# Patient Record
Sex: Male | Born: 1990
Health system: Southern US, Community
[De-identification: ages and names within clinical notes are randomized; demographics above are authoritative.]

## PROBLEM LIST (undated history)

## (undated) DIAGNOSIS — U071 COVID-19: Secondary | ICD-10-CM

## (undated) DIAGNOSIS — G919 Hydrocephalus, unspecified: Secondary | ICD-10-CM

## (undated) DIAGNOSIS — G40909 Epilepsy, unspecified, not intractable, without status epilepticus: Secondary | ICD-10-CM

## (undated) HISTORY — DX: COVID-19: U07.1

## (undated) HISTORY — PX: WISDOM TOOTH EXTRACTION: SHX21

## (undated) HISTORY — DX: Epilepsy, unspecified, not intractable, without status epilepticus: G40.909

## (undated) HISTORY — PX: OTHER SURGICAL HISTORY: SHX169

## (undated) HISTORY — DX: Hydrocephalus, unspecified: G91.9

---
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
## 2011-12-29 DIAGNOSIS — E259 Adrenogenital disorder, unspecified: Secondary | ICD-10-CM | POA: Insufficient documentation

## 2012-06-08 DIAGNOSIS — F8081 Childhood onset fluency disorder: Secondary | ICD-10-CM | POA: Insufficient documentation

## 2012-06-08 DIAGNOSIS — E559 Vitamin D deficiency, unspecified: Secondary | ICD-10-CM | POA: Insufficient documentation

## 2012-06-08 DIAGNOSIS — R569 Unspecified convulsions: Secondary | ICD-10-CM | POA: Insufficient documentation

## 2012-06-08 DIAGNOSIS — E230 Hypopituitarism: Secondary | ICD-10-CM | POA: Insufficient documentation

## 2012-06-08 DIAGNOSIS — Z8669 Personal history of other diseases of the nervous system and sense organs: Secondary | ICD-10-CM | POA: Insufficient documentation

## 2012-06-08 DIAGNOSIS — E2749 Other adrenocortical insufficiency: Secondary | ICD-10-CM | POA: Insufficient documentation

## 2012-06-08 DIAGNOSIS — E039 Hypothyroidism, unspecified: Secondary | ICD-10-CM | POA: Insufficient documentation

## 2012-06-08 DIAGNOSIS — Q892 Congenital malformations of other endocrine glands: Secondary | ICD-10-CM | POA: Insufficient documentation

## 2015-08-21 MED FILL — KEPPRA 750 MG TABLET: 750 | 30 days supply | Qty: 120 | Fill #0

## 2015-09-14 ENCOUNTER — Telehealth: Payer: Self-pay

## 2015-09-14 ENCOUNTER — Ambulatory Visit: Payer: Managed Care, Other (non HMO) | Admitting: Neurology

## 2015-09-14 ENCOUNTER — Ambulatory Visit (INDEPENDENT_AMBULATORY_CARE_PROVIDER_SITE_OTHER): Payer: BLUE CROSS/BLUE SHIELD | Admitting: Neurology

## 2015-09-14 ENCOUNTER — Encounter: Payer: Self-pay | Admitting: Neurology

## 2015-09-14 ENCOUNTER — Telehealth: Payer: Self-pay | Admitting: *Deleted

## 2015-09-14 VITALS — BP 116/78 | HR 85 | Ht 70.0 in | Wt 193.2 lb

## 2015-09-14 DIAGNOSIS — G919 Hydrocephalus, unspecified: Secondary | ICD-10-CM | POA: Diagnosis not present

## 2015-09-14 DIAGNOSIS — Z982 Presence of cerebrospinal fluid drainage device: Secondary | ICD-10-CM | POA: Diagnosis not present

## 2015-09-14 DIAGNOSIS — R569 Unspecified convulsions: Secondary | ICD-10-CM

## 2015-09-14 MED ORDER — KEPPRA 750 MG PO TABS: 1500.0000 mg | ORAL_TABLET | Freq: Two times a day (BID) | ORAL | Status: DC

## 2015-09-14 MED ORDER — VIMPAT 150 MG PO TABS: ORAL_TABLET | ORAL | Status: DC

## 2015-09-14 MED ORDER — VIMPAT 150 MG PO TABS: 150.0000 mg | ORAL_TABLET | Freq: Every day | ORAL | Status: DC

## 2015-09-14 NOTE — Progress Notes (Addendum)
[facility]    Provider:  Dr Lucia Gaskins Referring Provider: Arlina Robes, * Primary Care Physician:  Arlina Robes, MD  CC:  Seizure disorder  HPI:  Clayton Torres is a 25 y.o. male here as a referral from Dr. Merleen Milliner for seizures. He has a past medical history of hydrocephalus, panhypopituitarism and seizure disorder. He had shunt placement at [facility] at age 34 months in 6 and VP shunt revision at [facility] at age 58 years old in 2001. He was born with hydrocephalus. He had his shunt implanted at 7 months. He has had on revision since then in 2001. They follow regularly for his shunt and pituitary issues at [facility]. He has been on multiple seizure medications in the past including Actiom and Exteller xr with hyponatremia. He is currently on Vimpat and Keppra and having no side effects with good seizure control.  Everything else is going well. He works at a [employer] called [employer]. 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 Sleeping well. Hobbies and friends are all good. He has otherwise healthy and happy 25 year old male. He lives with his family. Mother and father are here today with him at his appointment and provide most information. Patient has had side effects and impaired seizure control to generic Keppra and he is thus maintained on brand only of Keppra. No other complaints or focal neurologic symptoms. Last seizure was in 05/2013 (see documentation below) after Vimpat was added to Keppra. He has no significant side effects from the antiepileptics and he maintains medication  Reviewed notes, labs and imaging from outside physicians, which showed:  In August 2006 Bulmaro fell at fitness gym in [place] we'll trying to lift weights. He had not eaten lunch that day and got very hot inside the gym he hit the left side of his head on the concrete floor that was covered with indoor outdoor carpet, and he had a generalized tonic-clonic  seizure. He was taken to the [facility] emergency room where he received 4 stitches and was admitted to pediatrics sways but then it for 24-hour observation as directed by chief of pediatric neurosurgery at [facility]. He was seen by Dr. Arleta Creek in consultation who did an EEG and placed [facility] on Dilantin. At that time they started seeing Dr. Desiree Lucy and [facility] it was the best neurologist close to home. He placed Trystan on Keppra 750 mg twice a day and stop the Dilantin. After going for over a year with no seizure activity, Dr. Blanche East is suggested trying to wean Jidenna off Keppra. Within 2 weeks in October 2007 patient had a generalized tonic-clonic seizure that lasted 20 minutes including post ictal state and he was taken to the [facility] emergency room where he was treated. In March 2014 all staying at a hotel in [facility] he had another generalized tonic-clonic seizure that lasted 3-5 minutes with shaking all over, drooling a post his ictal state lasting 15-20 minutes. He was brought to [facility] in [place] and had his Keppra level drawn. It appears at this time is when Keppra was changed to brand only. In June 2014 he had a grand mal seizure with postictal state lasting 15 minutes (the next or Keppra tablet and then 2 more in the morning and Keppra was increased to 750 mg 2 tablets at 8 AM and 2 tablets at 8 PM for a total of 1500 mg twice a  day. On October 2014 patient had another seizure in October and OXTELLAR XR and Aptiom which caused dangerously low blood sodium levels. After that time he was placed on Vimpat 100 mg twice a day and then increase to 150 mg twice a day which appears to have well controlled his seizures.  Labs drawn November 2016 included normal CBC, normal CMP with creatinine 0.60, vitamin D 25 hydroxy 39.4. Last Keppra levels ordered in June 2015 with Keppra normal level 20.3 and glucose of my 29.6  inches high however I have no idea if these were trough level.    Review of Systems: Patient complains of symptoms per HPI as well as the following symptoms: No CP, no SOB. Pertinent negatives per HPI. All others negative.   Social History   Social History  . Marital Status: Single    Spouse Name: N/A  . Number of Children: N/A  . Years of Education: N/A   Occupational History  . Not on file.   Social History Main Topics  . Smoking status: Never Smoker   . Smokeless tobacco: Not on file  . Alcohol Use: No  . Drug Use: No  . Sexual Activity: Not on file   Other Topics Concern  . Not on file   Social History Narrative   Lives w/ parents   Caffeine use: 1 cup coffee per day      PA's: [phone]       Family History  Problem Relation Age of Onset  . Hypertension    . Diabetes    . CAD    . COPD    . Seizures Neg Hx     Past Medical History  Diagnosis Date  . Hydrocephalus   . Seizure disorder [facility])     Past Surgical History  Procedure Laterality Date  . Other surgical history  1990    Shunt placement (VP) at [facility]  . Laparoscopic revision ventricular-peritoneal (v-p) shunt  2001    [facility]    Current Outpatient Prescriptions  Medication Sig Dispense Refill  . Aloe-Sodium Chloride (AYR SALINE NASAL GEL NA) Place 1 Dose into the nose daily.    Cathren Laine 4 MG/24HR PT24 patch Apply 4 mg topically daily.    . Ascorbic Acid (VITAMIN C) 1000 MG tablet Take 1,000 mg by mouth daily.    . Cholecalciferol (VITAMIN D-3 PO) Take 1,000 Units by mouth daily.    . ciclesonide (OMNARIS) 50 MCG/ACT nasal spray Place 2 sprays into both nostrils daily as needed for allergies.    . CORTEF 5 MG tablet Take 5 mg by mouth daily. TID- for sickness/stress BID- daily    . Fexofenadine HCl (ALLEGRA PO) Take 180 mg by mouth daily. Every other day    . fexofenadine-pseudoephedrine (ALLEGRA-D 24) 180-240 MG 24 hr tablet Take 1 tablet by mouth. Every other day    . KEPPRA 750 MG  tablet Take 2 tablets (1,500 mg total) by mouth 2 (two) times daily. 2 tabs 8am 2 tabs 8pm 120 tablet 2  . Multiple Vitamins tablet Take 1 tablet by mouth daily.    Marland Kitchen SYNTHROID 112 MCG tablet Take 112 mcg by mouth daily.    Marland Kitchen VIMPAT 150 MG TABS Take 1 tablet twice daily. 1 tablet at 8am and 1 tablet at 8pm. 60 tablet 11   No current facility-administered medications for this visit.    Allergies as of 09/14/2015 - Review Complete 09/14/2015  Allergen Reaction Noted  . Benzonatate Diarrhea 09/14/2015  . Ceftriaxone  Hives 09/14/2015  . Eslicarbazepine Other (See Comments) 09/14/2015  . Oxcarbazepine Other (See Comments) 09/14/2015    Vitals: BP 116/78 mmHg  Pulse 85  Ht 5\' 10"  (1.778 m)  Wt 193 lb 3.2 oz (87.635 kg)  BMI 27.72 kg/m2 Last Weight:  Wt Readings from Last 1 Encounters:  09/14/15 193 lb 3.2 oz (87.635 kg)   Last Height:   Ht Readings from Last 1 Encounters:  09/14/15 5\' 10"  (1.778 m)   Physical exam: Exam: Gen: NAD, conversant, well nourised,  well groomed                     CV: RRR, no MRG. No Carotid Bruits. No peripheral edema, warm, nontender Eyes: Conjunctivae clear without exudates or hemorrhage  Neuro: Detailed Neurologic Exam  Speech:    Speech is normal; fluent and spontaneous with normal comprehension.  Cognition:    The patient is oriented to person, place, and time;     recent and remote memory intact;     language fluent;     normal attention, concentration,     fund of knowledge Cranial Nerves:    The pupils are equal, round, and reactive to light. The fundi are flat. Visual fields are full to finger confrontation. Extraocular movements are intact. Trigeminal sensation is intact and the muscles of mastication are normal. The face is symmetric. The palate elevates in the midline. Hearing intact. Voice is normal. Shoulder shrug is normal. The tongue has normal motion without fasciculations.   Coordination:    Normal finger to nose and heel to  shin. Normal rapid alternating movements.   Gait:    Heel-toe and tandem gait are normal.   Motor Observation:    No asymmetry, no atrophy, and no involuntary movements noted. Tone:    Normal muscle tone.    Posture:    Posture is normal. normal erect    Strength:    Strength is V/V in the upper and lower limbs.      Sensation: intact to LT     Reflex Exam:  DTR's:    Deep tendon reflexes in the upper and lower extremities are normal bilaterally.   Toes:    The toes are downgoing bilaterally.   Clonus:    Clonus is absent.       Assessment/Plan:  This is a lovely 25 year old male here with his parents with a past medical history of hydrocephalus, panhypopituitarism and seizure disorder. He had shunt placement at [facility] at age 35 months in 50 and VP shunt revision at [facility] at age 33 years old in 2001. Patient is maintained on Keppra 1500 mg twice daily and Vimpat 150 mg twice daily with good seizure control. Last seizure was in October 2014. We'll continue these medications including brand name Keppra only. Follow-up in 6 months.    Patient was seeing NSY and endocrinology at [facility] No records there from Neurology. His neurologist Dr. Shary Decamp, will request notes, eegs and images. Thanks  Naomie Dean, MD  [facility] [address] [place], [place] [address]  Phone [phone] Fax [phone]

## 2015-09-14 NOTE — Telephone Encounter (Signed)
Called and spoke to [employer]. She is going to start PA's for vimpat and brand name Keppra for pt. Explained pt only has about 4 days left. Their insurance changed.

## 2015-09-14 NOTE — Patient Instructions (Signed)
Remember to drink plenty of fluid, eat healthy meals and do not skip any meals. Try to eat protein with a every meal and eat a healthy snack such as fruit or nuts in between meals. Try to keep a regular sleep-wake schedule and try to exercise daily, particularly in the form of walking, 20-30 minutes a day, if you can.   As far as your medications are concerned, I would like to suggest: Continue Current medications  I would like to see you back in 6 months, sooner if we need to. Please call us with any interim questions, concerns, problems, updates or refill requests.   Please also call us for any test results so we can go over those with you on the phone.  My clinical assistant and will answer any of your questions and relay your messages to me and also relay most of my messages to you.   Our phone number is [phone]. We also have an after hours call service for urgent matters and there is a physician on-call for urgent questions. For any emergencies you know to call 911 or go to the nearest emergency room

## 2015-09-14 NOTE — Telephone Encounter (Signed)
We contacted ins regarding both Keppra and Vimpat Rx's.  Spoke with [employer].  She said Vimpat is covered under the patient's plan, with no prior auth required.  All info for Keppra has been provided, and the request has been forwarded for review.   Ins has approved the request for coverage on Keppra effective until 09/13/2016 Ref # [id number]

## 2015-09-15 ENCOUNTER — Telehealth: Payer: Self-pay | Admitting: Neurology

## 2015-09-15 ENCOUNTER — Encounter: Payer: Self-pay | Admitting: Neurology

## 2015-09-15 DIAGNOSIS — G919 Hydrocephalus, unspecified: Secondary | ICD-10-CM | POA: Insufficient documentation

## 2015-09-15 DIAGNOSIS — R569 Unspecified convulsions: Secondary | ICD-10-CM | POA: Insufficient documentation

## 2015-09-15 DIAGNOSIS — Z982 Presence of cerebrospinal fluid drainage device: Secondary | ICD-10-CM | POA: Insufficient documentation

## 2015-09-15 NOTE — Telephone Encounter (Signed)
Pt's mother called and wanted to say "Thank You!!" for getting pts prior auth done so quickly for his medication. She received approval yesterday evening and said she cried she was so happy . She just wanted to let you all know that she really appreciated the hard work. No call back is necessary

## 2015-09-16 ENCOUNTER — Telehealth: Payer: Self-pay | Admitting: *Deleted

## 2015-09-16 NOTE — Telephone Encounter (Signed)
Release faxed to Dr.Fracis Clent Ridges requesting notes, imaging and lab report on 09/16/15.

## 2015-09-18 ENCOUNTER — Telehealth: Payer: Self-pay | Admitting: *Deleted

## 2015-09-18 NOTE — Telephone Encounter (Signed)
Patient progress notes, labs,are on [employer].

## 2015-09-22 NOTE — Telephone Encounter (Signed)
Received approval letter from [facility] for Keppra. Approval effective 09/14/2015-09/13/2016.  Letter dated 09/15/2015.

## 2015-09-23 ENCOUNTER — Encounter: Payer: Self-pay | Admitting: Neurology

## 2015-10-16 ENCOUNTER — Telehealth: Payer: Self-pay | Admitting: Neurology

## 2015-10-16 ENCOUNTER — Other Ambulatory Visit: Payer: Self-pay | Admitting: Neurology

## 2015-10-16 DIAGNOSIS — G40309 Generalized idiopathic epilepsy and epileptic syndromes, not intractable, without status epilepticus: Secondary | ICD-10-CM

## 2015-10-16 MED ORDER — LACOSAMIDE 200 MG PO TABS: 200.0000 mg | ORAL_TABLET | Freq: Two times a day (BID) | ORAL | Status: DC

## 2015-10-16 NOTE — Telephone Encounter (Addendum)
Please call patient' smother and let her know we apologize. The oncall said she called multiple times but no one answered.  I will call when I am done with patients as long as Ronaldo MiyamotoKyle is ok. I do think a referral to an epileptologist is warranted as much as we really enjoyed seeing them.  I will call them after I am done seeing patient this morning. thanks

## 2015-10-16 NOTE — Telephone Encounter (Signed)
Dr Ahern- what would you like to do? 

## 2015-10-16 NOTE — Telephone Encounter (Addendum)
Called and spoke to patient's mother. Apologized for what happened, on call tried to call multiple times. Relayed message per Dr Lucia GaskinsAhern. She verbalized understanding and knows to expect a call from Dr Lucia GaskinsAhern after she is done seeing pt's. Told her to call before 12pm if she needs anything.

## 2015-10-16 NOTE — Telephone Encounter (Signed)
I called once in March 9th without reaching Danae ChenBetty Langhans at [phone], left message twice on March 10th, was able to talk with Danae ChenBetty Lesniak briefly on 3rd attempt in March 10th, she has to hang up to pick another phone call.  His treating physician Dr. Lucia GaskinsAhern is aware that patient's family has called office.

## 2015-10-16 NOTE — Telephone Encounter (Addendum)
I spoke to patient, mother and father who are all very lovely. Apologized for the on call not being able to reach them last night.  Since Clayton Torres is still having seizures and on 2 medications, she would like to switch care to an epileptologist which I agree with.  I recommend Patrcia DollyKaren Aquino here at [facility]. She is an Herbalistepileptologist. I will forward a request to her. Thanks. Will increase his Vimapt to 200mg  twice daily. Called pharmacy.

## 2015-10-16 NOTE — Telephone Encounter (Signed)
Pt's mother called said pt had a seizure yesterday 10/15/15 about 7:45 pm. Pt was sitting at desk writing a check, she asked him a question but he didn't answer her. His face became red and he started jerking she gently pulled him over to the floor and kept him on his side. He was straining, jerking and drooling it lasted about 5-6 minutes. Postictal state was about 10-12 minutes. EMS was called, IV started and he transported to [facility]. He was able to walk to EMS vehicle at home and talked with EMS staff on the way to [facility]. He was given Keppra 1500mg  at hospital po and vimpat 150mg . Keppra 1grm IV was given at 10:14pm. Labs and CT were done and were all normal, shunt was checked and was normal. No EEG done. Dx was acute epilepitic seizure. He was discharged around midnight. F/u with neurologist tomorrow. Last seizure was 10/14. She said Dr Lucia GaskinsAhern was interested in referring pt to epileptologist. She said whatever Dr Lucia GaskinsAhern wants is what they will do. Pt's mother said she called [facility] after hours and got Cathy about 8pm (she was still at home) and was told she would get in touch with neurologist on call. She gave Lynden AngCathy her cell number. About 9:15 she called [facility] after hours again and spoke with Gala Romneyoug, said he saw the notes where she had called earlier. She gave him the main number and ED number at [facility] and cell number again. She said she never received a call from [facility]. She is very disappointed.

## 2015-10-22 ENCOUNTER — Encounter: Payer: Self-pay | Admitting: Neurology

## 2015-10-22 ENCOUNTER — Other Ambulatory Visit: Payer: Self-pay | Admitting: Neurology

## 2015-10-22 DIAGNOSIS — G40309 Generalized idiopathic epilepsy and epileptic syndromes, not intractable, without status epilepticus: Secondary | ICD-10-CM

## 2015-10-23 ENCOUNTER — Telehealth: Payer: Self-pay | Admitting: *Deleted

## 2015-10-23 ENCOUNTER — Telehealth: Payer: Self-pay | Admitting: Neurology

## 2015-10-23 DIAGNOSIS — Z5181 Encounter for therapeutic drug level monitoring: Secondary | ICD-10-CM

## 2015-10-23 NOTE — Telephone Encounter (Signed)
Spoke to patient, we need labs but he is over an hour away. We need to get those ordered [facility]. Annabelle HarmanDana, can you help?  I need a Keppra level, Lacosamide level and CMP and CBC with diff  Annabelle HarmanDana, can you help?

## 2015-10-23 NOTE — Telephone Encounter (Signed)
Mother called office. She would like lab orders to be faxed to [facility] (Fax: [phone], attn: Marisue IvanLiz). Advised Dr Lucia GaskinsAhern not in office. Will have to have WID sign lab orders for me to fax. They are going Monday morning to get labs done. They would like labs to be placed as STAT so they can receive them Monday afternoon or Tuesday morning. Mother would also like me to ask WID if its okay if her son takes an extra dose of Keppra everyday until they figure out the lab results. She is afraid he is going to have another seizure. She was told by the ER when they went that this would be ok. He currently takes 750mg  (2tablets, 2 times daily). Advised [facility] Dr Anne HahnWillis and call her back to advise before I leave today. She verbalized understanding.

## 2015-10-23 NOTE — Telephone Encounter (Signed)
I called the patient, talk with the mother. The patient had a seizure last week, the first seizure since 2014. The patient is on maximum doses of the Keppra and the Vimpat now. The last Keppra level was somewhat low, it'll be rechecked next week. I would not recommend taking extra Keppra as this will change the blood levels.

## 2015-10-23 NOTE — Telephone Encounter (Signed)
Faxed lab orders to [facility]. Attn: liz as requested. Fax: [phone]. Received confirmation.

## 2015-10-26 NOTE — Telephone Encounter (Signed)
Noted Kara Meadmma thanks.

## 2015-10-26 NOTE — Telephone Encounter (Signed)
Dr Lucia GaskinsAhern-- I already faxed lab orders to [facility] on Friday for lacosamide and keppra level? I had Dr Anne HahnWillis sign them. There were no orders in for CMP/CBC? Please advise. The mother said they were going this morning for those labs. [facility]

## 2015-10-27 ENCOUNTER — Telehealth: Payer: Self-pay | Admitting: *Deleted

## 2015-10-27 NOTE — Telephone Encounter (Signed)
Called and spoke to [place] [facility]. She stated labs for keppra, lacosamide not back yet. I gave her fax [phone] to have them fax results once back.

## 2015-10-28 ENCOUNTER — Encounter: Payer: Self-pay | Admitting: Neurology

## 2015-10-28 NOTE — Telephone Encounter (Signed)
Dr Lucia GaskinsAhern- please advise. I can call with results after you review, thank

## 2015-10-28 NOTE — Telephone Encounter (Signed)
They are normal thanks

## 2015-10-28 NOTE — Telephone Encounter (Signed)
Received lab results. Gave to Dr Lucia GaskinsAhern to review.

## 2015-10-28 NOTE — Telephone Encounter (Signed)
Advised both keppra and vimpat levels both within normal reference range. keppra 13.5 (reference range 10-40) and lacosamide 6/3 (reference range 5-10). She wrote values down. Advised we will follow in the future to continue to make sure he is within reference range for both. She verbalized understanding and knows to call if pt has new/worsening sx.

## 2015-10-28 NOTE — Telephone Encounter (Signed)
Pt's mother called wanting to know if lab results have come in. I told her our office has received them but Dr. Lucia GaskinsAhern has not reviewed them and as soon as she does she will call. Pt's mother is requesting a call to [phone]

## 2015-11-06 ENCOUNTER — Encounter: Payer: Self-pay | Admitting: Neurology

## 2015-11-09 ENCOUNTER — Ambulatory Visit (INDEPENDENT_AMBULATORY_CARE_PROVIDER_SITE_OTHER): Payer: BLUE CROSS/BLUE SHIELD | Admitting: Neurology

## 2015-11-09 ENCOUNTER — Encounter: Payer: Self-pay | Admitting: Neurology

## 2015-11-09 VITALS — BP 114/72 | HR 81 | Ht 70.0 in | Wt 194.0 lb

## 2015-11-09 DIAGNOSIS — G40209 Localization-related (focal) (partial) symptomatic epilepsy and epileptic syndromes with complex partial seizures, not intractable, without status epilepticus: Secondary | ICD-10-CM | POA: Diagnosis not present

## 2015-11-09 DIAGNOSIS — Q039 Congenital hydrocephalus, unspecified: Secondary | ICD-10-CM | POA: Diagnosis not present

## 2015-11-09 MED ORDER — KEPPRA 750 MG PO TABS: 1500.0000 mg | ORAL_TABLET | Freq: Two times a day (BID) | ORAL | Status: DC

## 2015-11-09 MED ORDER — LACOSAMIDE 200 MG PO TABS: 200.0000 mg | ORAL_TABLET | Freq: Two times a day (BID) | ORAL | Status: DC

## 2015-11-09 NOTE — Patient Instructions (Addendum)
1. Continue Keppra 750mg : take 2 tablets twice a day 2. Continue Vimpat 200mg : take 1 tablet twice a day 3. Schedule 1-hour EEG (not sleep-deprived) 4. Look into the Epilepsy Foundation website as a good resource (http://www.epilepsy.com) 5. Practice good sleep hygiene 6. Follow-up in 6 months, call for any problems  Seizure Precautions: 1. If medication has been prescribed for you to prevent seizures, take it exactly as directed.  Do not stop taking the medicine without talking to your doctor first, even if you have not had a seizure in a long time.   2. Avoid activities in which a seizure would cause danger to yourself or to others.  Don't operate dangerous machinery, swim alone, or climb in high or dangerous places, such as on ladders, roofs, or girders.  Do not drive unless your doctor says you may.  3. If you have any warning that you may have a seizure, lay down in a safe place where you can't hurt yourself.    4.  No driving for 6 months from last seizure, as per [place] law.   Please refer to the following link on the Epilepsy Foundation of America's website for more information: http://www.epilepsyfoundation.org/answerplace/Social/driving/drivingu.cfm   5.  Maintain good sleep hygiene. Avoid alcohol.  6.  Contact your doctor if you have any problems that may be related to the medicine you are taking.  7.  Call 911 and bring the patient back to the ED if:        A.  The seizure lasts longer than 5 minutes.       B.  The patient doesn't awaken shortly after the seizure  C.  The patient has new problems such as difficulty seeing, speaking or moving  D.  The patient was injured during the seizure  E.  The patient has a temperature over 102 F (39C)  F.  The patient vomited and now is having trouble breathing

## 2015-11-09 NOTE — Progress Notes (Signed)
NEUROLOGY CONSULTATION NOTE  Clayton Torres MRN: [id number] DOB: 04-02-1991  Referring provider: Dr. Naomie Dean Primary care provider: Dr. Ardean Larsen  Reason for consult:  Second opinion after recent breakthrough seizure  Dear Dr Lucia Gaskins:  Thank you for your kind referral of Clayton Torres for consultation of the above symptoms. Although his history is well known to you, please allow me to reiterate it for the purpose of our medical record. The patient was accompanied to the clinic by his parents who also provide collateral information. Records and images were personally reviewed where available.  HISTORY OF PRESENT ILLNESS: This is a pleasant 25 year old left-handed man with a history of congenital hydrocephalus s/p VP shunt at age 50 months and shunt revision in 2001, hypopituitarism, and seizures since 2006. The first seizure occurred in August 2006 in the setting of the patient not eating and being in a very hot environment. He hit the left side of his head and had a witnessed GTC. He has no recollection/warning that he could remember. He had an EEG at that time and was told they could "see where the spike was," and was started on Dilantin. He was switched by his neurologist in [place] to Keppra a few months after. He went over a year without any seizures and medication taper was discussed. Within 2 weeks, he had a seizure in October 2007 out of sleep. Keppra was resumed and he did well for almost 7 years without any seizures until January 2014 when he had another GTC and Keppra dose was increased. He had several seizures that year, in March 2014, June 2014, and October 2014. He recalls feeling dizzy with the seizure in 05/2013, he fell in the tub. He was taking Keppra  BID and Oxtellar was added. He had hyponatremia with a sodium of 119 and Oxtellar was stopped. He was also tried on Aptiom which also caused hyponatremia. He was started on Vimpat in October 2014 and dose of  BID  seemed to be the "magic combination" for him. His sodium levels stabilized between 135-138. Last Keppra level in March 2016 was 15. A Vimpat level in June 2015 was 29. He was again doing well with no seizures for almost 3 years until he had a witnessed convulsion on 10/15/15. He was at the computer writing a check, then his mother reported he did not answer her then turned red, followed by jerking and drooling for 5 minutes. No tongue bite or incontinence. After the seizure, he had sonorous respirations. He was also noted to break out in petechiae over his face. He was brought to [facility] ER where bloodwork where CBC and CMP were normal, Keppra level was low at 4.8. He had a head CT without contrast which showed the right-sided transparietal VP catheter terminating just left of the midline, stable to prior study, no hydrocephalus. He had been seeing neurologist Dr. Lucia Gaskins and Vimpat dose was increased to  BID. Most recent drug levels on 10/2015 showed a Keppra level of 13.7 (ref 10-40) and Vimpat level of 6.3 (ref 5-10).  They report that he got less sleep the night prior to the recent seizure, with only 4-1/2 hours of sleep. He denied any missed medication doses, no recent infections or alcohol intake. His parents deny any episodes of staring/unresponsiveness. He denies any gaps in time, olfactory/gustatory hallucinations, deja vu, rising epigastric sensation, focal numbness/tingling/weakness, myoclonic jerks. He works at a [employer], he does not drive.   Epilepsy Risk Factors:  Patient was born premature at 7 months with congenital hydrocephalus s/p VP shunt. He has a history of panhypopituitarism. Otherwise no history of febrile convulsions, CNS infections such as meningitis/encephalitis, significant traumatic brain injury, or family history of seizures.   Prior AEDs: Dilantin, generic Keppra (side effects and impaired seizure control per prior records)  PAST MEDICAL HISTORY: Past Medical  History  Diagnosis Date  . Hydrocephalus   . Seizure disorder (HCC)     PAST SURGICAL HISTORY: Past Surgical History  Procedure Laterality Date  . Other surgical history  1990    Shunt placement (VP) at [facility]  . Laparoscopic revision ventricular-peritoneal (v-p) shunt  2001    [facility]    MEDICATIONS: Current Outpatient Prescriptions on File Prior to Visit  Medication Sig Dispense Refill  . Aloe-Sodium Chloride (AYR SALINE NASAL GEL NA) Place 1 Dose into the nose daily.    Cathren Laine 4 MG/24HR PT24 patch Apply 4 mg topically daily.    . Ascorbic Acid (VITAMIN C) 1000 MG tablet Take 1,000 mg by mouth daily.    . Cholecalciferol (VITAMIN D-3 PO) Take 1,000 Units by mouth daily.    . ciclesonide (OMNARIS) 50 MCG/ACT nasal spray Place 2 sprays into both nostrils daily as needed for allergies.    . CORTEF 5 MG tablet Take 5 mg by mouth daily. TID- for sickness/stress BID- daily    . Fexofenadine HCl (ALLEGRA PO) Take 180 mg by mouth daily. Every other day    . fexofenadine-pseudoephedrine (ALLEGRA-D 24) 180-240 MG 24 hr tablet Take 1 tablet by mouth. Every other day    . KEPPRA 750 MG tablet Take 2 tablets (1,500 mg total) by mouth 2 (two) times daily. 2 tabs 8am 2 tabs 8pm 120 tablet 2  . lacosamide (VIMPAT) 200 MG TABS tablet Take 1 tablet (200 mg total) by mouth 2 (two) times daily. 60 tablet 5  . Multiple Vitamins tablet Take 1 tablet by mouth daily.    Marland Kitchen SYNTHROID 112 MCG tablet Take 112 mcg by mouth daily.     No current facility-administered medications on file prior to visit.    ALLERGIES: Allergies  Allergen Reactions  . Benzonatate Diarrhea  . Ceftriaxone Hives  . Eslicarbazepine Other (See Comments)    Low soduim  . Oxcarbazepine Other (See Comments)    Low soduim    FAMILY HISTORY: Family History  Problem Relation Age of Onset  . Hypertension    . Diabetes    . CAD    . COPD    . Seizures Neg Hx     SOCIAL HISTORY: Social History   Social History  .  Marital Status: Single    Spouse Name: N/A  . Number of Children: N/A  . Years of Education: N/A   Occupational History  . Not on file.   Social History Main Topics  . Smoking status: Never Smoker   . Smokeless tobacco: Not on file  . Alcohol Use: No  . Drug Use: No  . Sexual Activity: Not on file   Other Topics Concern  . Not on file   Social History Narrative   Lives w/ parents   Caffeine use: 1 cup coffee per day      PA's: [phone]       REVIEW OF SYSTEMS: Constitutional: No fevers, chills, or sweats, no generalized fatigue, change in appetite Eyes: No visual changes, double vision, eye pain Ear, nose and throat: No hearing loss, ear pain, nasal congestion, sore throat Cardiovascular: No  chest pain, palpitations Respiratory:  No shortness of breath at rest or with exertion, wheezes GastrointestinaI: No nausea, vomiting, diarrhea, abdominal pain, fecal incontinence Genitourinary:  No dysuria, urinary retention or frequency Musculoskeletal:  No neck pain, back pain Integumentary: No rash, pruritus, skin lesions Neurological: as above Psychiatric: No depression, insomnia, anxiety Endocrine: No palpitations, fatigue, diaphoresis, mood swings, change in appetite, change in weight, increased thirst Hematologic/Lymphatic:  No anemia, purpura, petechiae. Allergic/Immunologic: no itchy/runny eyes, nasal congestion, recent allergic reactions, rashes  PHYSICAL EXAM: Filed Vitals:   11/09/15 1356  BP: 114/72  Pulse: 81   General: No acute distress Head:  Normocephalic/atraumatic Eyes: Fundoscopic exam shows bilateral sharp discs, no vessel changes, exudates, or hemorrhages Neck: supple, no paraspinal tenderness, full range of motion Back: No paraspinal tenderness Heart: regular rate and rhythm Lungs: Clear to auscultation bilaterally. Vascular: No carotid bruits. Skin/Extremities: No rash, no edema Neurological Exam: Mental status: alert and oriented to person,  place, and time, no dysarthria or aphasia, Fund of knowledge is appropriate.  Recent and remote memory are intact. 3/3 delayed recall. Attention and concentration are normal.    Able to name objects and repeat phrases. Cranial nerves: CN I: not tested CN II: pupils equal, round and reactive to light, visual fields intact, fundi unremarkable. CN III, IV, VI:  full range of motion, no nystagmus, no ptosis CN V: facial sensation intact CN VII: upper and lower face symmetric CN VIII: hearing intact to finger rub CN IX, X: gag intact, uvula midline CN XI: sternocleidomastoid and trapezius muscles intact CN XII: tongue midline Bulk & Tone: normal, no fasciculations. Motor: 5/5 throughout with no pronator drift. Sensation: intact to light touch, cold, pin, vibration and joint position sense.  No extinction to double simultaneous stimulation.  Romberg test negative Deep Tendon Reflexes: +2 throughout, no ankle clonus Plantar responses: downgoing bilaterally Cerebellar: no incoordination on finger to nose, heel to shin. No dysdiadochokinesia Gait: narrow-based and steady, able to tandem walk adequately. Tremor: none  IMPRESSION: This is a pleasant 25 year old left-handed man with a history of congenital hydrocephalus s/p VP shunt, panhypopituitarism, and seizures since 2006. All his seizures have been convulsive seizures, possibly focal to bilateral tonic-clonic epilepsy. They have been told in the past of an abnormal EEG. Head CT after most recent seizure was unremarkable with stable shunt. A 1-hour EEG will be ordered to classify his seizures. He has a history of being seizure-free for up to 7 years, and had recently been seizure-free from 2014 until last 10/15/15 when he had a breakthrough convulsion. His Keppra level was low at 4.8, they report compliance to medication, we discussed how blood levels can go up and down, he was also sleep-deprived which may have further lowered seizure threshold. A  repeat Keppra trough level on the same Keppra dose of 1500mg  BID was 13.7. Vimpat dose was increased to 200mg  BID, which I agree with. Continue current doses of AEDs. We discussed avoidance of seizure triggers, including missed medication, sleep deprivation, and alcohol use. His mother had several questions regarding caffeine, TV/internet, weight, diet, which were answered to the best of my ability. He does not drive and is aware of [place] driving laws to stop driving after a seizure until 6 months seizure-free. He will follow-up in 6 months and knows to call our office for any problems.   Thank you for allowing me to participate in the care of this patient. Please do not hesitate to call for any questions or concerns.  Patrcia Dolly, M.D.  CC: Dr. Merleen Milliner, Dr. Lucia Gaskins

## 2015-11-11 ENCOUNTER — Encounter: Payer: Self-pay | Admitting: Neurology

## 2015-11-11 DIAGNOSIS — G40209 Localization-related (focal) (partial) symptomatic epilepsy and epileptic syndromes with complex partial seizures, not intractable, without status epilepticus: Secondary | ICD-10-CM | POA: Insufficient documentation

## 2015-11-11 DIAGNOSIS — Q039 Congenital hydrocephalus, unspecified: Secondary | ICD-10-CM | POA: Insufficient documentation

## 2015-11-19 ENCOUNTER — Ambulatory Visit (INDEPENDENT_AMBULATORY_CARE_PROVIDER_SITE_OTHER): Payer: BLUE CROSS/BLUE SHIELD | Admitting: Neurology

## 2015-11-19 DIAGNOSIS — G40209 Localization-related (focal) (partial) symptomatic epilepsy and epileptic syndromes with complex partial seizures, not intractable, without status epilepticus: Secondary | ICD-10-CM

## 2015-11-20 ENCOUNTER — Encounter: Payer: Self-pay | Admitting: Neurology

## 2015-11-23 ENCOUNTER — Other Ambulatory Visit: Payer: Self-pay | Admitting: Neurology

## 2015-11-23 DIAGNOSIS — G40209 Localization-related (focal) (partial) symptomatic epilepsy and epileptic syndromes with complex partial seizures, not intractable, without status epilepticus: Secondary | ICD-10-CM

## 2015-11-23 MED ORDER — KEPPRA 750 MG PO TABS: 1500.0000 mg | ORAL_TABLET | Freq: Two times a day (BID) | ORAL | Status: DC

## 2015-11-23 MED ORDER — LACOSAMIDE 200 MG PO TABS: 200.0000 mg | ORAL_TABLET | Freq: Two times a day (BID) | ORAL | Status: DC

## 2015-11-24 ENCOUNTER — Telehealth: Payer: Self-pay | Admitting: Neurology

## 2015-11-24 DIAGNOSIS — G40209 Localization-related (focal) (partial) symptomatic epilepsy and epileptic syndromes with complex partial seizures, not intractable, without status epilepticus: Secondary | ICD-10-CM

## 2015-11-24 NOTE — Procedures (Signed)
ELECTROENCEPHALOGRAM REPORT  Date of Study: 11/19/2015  Patient's Name: Clayton ReapKyle P Zuercher MRN: [id number] Date of Birth: 1990/12/17  Referring Provider: Dr. Patrcia DollyKaren Aquino  Clinical History: This is a 25 year old man with a history of congenital hydrocephalus s/p VP shunt, with convulsive seizures, seizure-free for 3 years until 10/15/15.   Medications: Keppra, Vimpat, Androderm, Vitamin C and D, Omnaris, Cortef, Allegra, multivitamins, Synthroid  Technical Summary: A multichannel digital 1-hour sleep-deprived EEG recording measured by the international 10-20 system with electrodes applied with paste and impedances below 5000 ohms performed in our laboratory with EKG monitoring in an awake and asleep patient.  Hyperventilation and photic stimulation were performed.  The digital EEG was referentially recorded, reformatted, and digitally filtered in a variety of bipolar and referential montages for optimal display.    Description: The patient is awake and asleep during the recording.  During maximal wakefulness, there is a symmetric, medium voltage 10 Hz posterior dominant rhythm that attenuates with eye opening.  There is occasional focal theta and delta slowing seen over the left posterior temporal region. During drowsiness and sleep, there is an increase in theta slowing of the background.  Vertex waves and symmetric sleep spindles were seen.  Hyperventilation and photic stimulation did not elicit any abnormalities.  There were occasional high voltage left posterior temporal spikes and sharp waves seen with phase reversal over T5. There is a single low to medium voltage sharp transient seen over the right frontal region. There were no electrographic seizures seen.    EKG lead was unremarkable.  Impression: This 1-hour awake and asleep EEG is abnormal due to the presence of: 1. Occasional focal slowing over the left posterior temporal region 2. Occasional epileptiform discharges over the left  posterior temporal region 3. Single sharp transient over the right frontal region  Clinical Correlation of the above findings indicates focal cerebral dysfunction over the left posterior temporal region suggestive of underlying structural or physiologic abnormality with possible epileptogenic potential. The single sharp transient seen over the right frontal region is of unclear clinical significance. Clinical correlation is advised.     Patrcia DollyKaren Aquino, M.D.

## 2015-11-24 NOTE — Telephone Encounter (Signed)
Tried calling Clayton Torres cell, no answer. Spoke to his mother about EEG results showing left posterior temporal epileptiform discharges and focal slowing. Would recommend MRI brain with and without contrast (open MRI). Continue current medications.   Tiffany, can you pls order open MRI brain with and without contrast seizure protocol? Thanks!

## 2015-11-30 NOTE — Telephone Encounter (Signed)
Patient has a VP shunt since childhood which was replaced in 2001. I have spoken with the [facility] MRI dept to check and see if patient is safe to have MRI scan. I faxed over the Implant information from patient's 2001 procedure for review also.

## 2015-11-30 NOTE — Telephone Encounter (Signed)
Called patient and spoke with his mother. Notified her of MRI appt on 12/04/15 # 3:00 pm with a 2:45 pm arrival @ [facility].

## 2015-11-30 NOTE — Telephone Encounter (Signed)
Received a voice mail msg from [place] in the MRI dept stating that after they reviewed patient device information that patient was safe to have MRI, and that I could proceed with scheduling.

## 2015-12-04 ENCOUNTER — Ambulatory Visit (HOSPITAL_COMMUNITY)
Admission: RE | Admit: 2015-12-04 | Discharge: 2015-12-04 | Disposition: A | Discharge status: Home / Self Care | Payer: BLUE CROSS/BLUE SHIELD | Source: Ambulatory Visit | Attending: Neurology | Admitting: Neurology

## 2015-12-04 DIAGNOSIS — Q048 Other specified congenital malformations of brain: Secondary | ICD-10-CM | POA: Insufficient documentation

## 2015-12-04 DIAGNOSIS — G40209 Localization-related (focal) (partial) symptomatic epilepsy and epileptic syndromes with complex partial seizures, not intractable, without status epilepticus: Secondary | ICD-10-CM | POA: Diagnosis present

## 2015-12-04 DIAGNOSIS — Z982 Presence of cerebrospinal fluid drainage device: Secondary | ICD-10-CM | POA: Diagnosis not present

## 2015-12-04 MED ORDER — GADOBENATE DIMEGLUMINE 529 MG/ML IV SOLN: 20.0000 mL | Freq: Once | INTRAVENOUS | Status: DC | PRN

## 2015-12-04 MED ORDER — GADOBENATE DIMEGLUMINE 529 MG/ML IV SOLN
20.0000 mL | Freq: Once | INTRAVENOUS | Status: AC | PRN
  Administered 2015-12-04: 20 mL via INTRAVENOUS

## 2015-12-09 ENCOUNTER — Telehealth: Payer: Self-pay | Admitting: Family Medicine

## 2015-12-09 NOTE — Telephone Encounter (Signed)
-----   Message from [place] Chardonika K Patel, DO sent at 12/07/2015  4:53 PM EDT ----- Elmarie Shileyiffany, can you inform the patient that his MRI does not show any acute changes, but there is evidence of developmental abnormalities which has most likely been longstanding and most likely explains his seizures.  Does he have any prior MRIs that we can compare this do?  Dr. Karel JarvisAquino will review the images and go over it at his next visit.  He should continue medication as he is taking. Thanks.

## 2015-12-09 NOTE — Telephone Encounter (Signed)
I spoke with patients mother/Bettye and notified her of results. She did review the report on my chart. She did have questions about number (1) on the impression, she would like an explanation on what all of that means. She did say they were aware of the results noted in the rest of the impression. She would like to speak with you if possible.

## 2015-12-09 NOTE — Telephone Encounter (Signed)
Returned call to patient's mother and answered all questions.  Last MRI brain was about 20 years ago which she will try to find the report.  She was previously aware of his developmental abnormalities seen on MRI, but was asking if there was surgery that could be performed to help with his seizures, which I did not see.  I recommended that he continue medical management.  Dr. Karel JarvisAquino will certainly readdress this at the next visit.   Donika K. Allena KatzPatel, DO

## 2016-02-23 ENCOUNTER — Telehealth: Payer: Self-pay | Admitting: Neurology

## 2016-02-23 NOTE — Telephone Encounter (Signed)
Records from [place]:  MRI [facility] gland [facility] on 12/16/1997:  Decreased pituitary size, absence of infundibular stalk and ectopic neurohypophysis consistent with congenital pituitary anomaly/ies which may be associated with panhypopituitarism and GHD.   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  History of ventricular shunt tube insertion for hydrocephalus.

## 2016-03-17 ENCOUNTER — Ambulatory Visit: Payer: BLUE CROSS/BLUE SHIELD | Admitting: Neurology

## 2016-05-16 ENCOUNTER — Ambulatory Visit (INDEPENDENT_AMBULATORY_CARE_PROVIDER_SITE_OTHER): Payer: BLUE CROSS/BLUE SHIELD | Admitting: Neurology

## 2016-05-16 ENCOUNTER — Encounter: Payer: Self-pay | Admitting: Neurology

## 2016-05-16 VITALS — BP 122/70 | HR 89 | Temp 97.9°F | Ht 70.0 in | Wt 196.5 lb

## 2016-05-16 DIAGNOSIS — Q039 Congenital hydrocephalus, unspecified: Secondary | ICD-10-CM

## 2016-05-16 DIAGNOSIS — G40209 Localization-related (focal) (partial) symptomatic epilepsy and epileptic syndromes with complex partial seizures, not intractable, without status epilepticus: Secondary | ICD-10-CM | POA: Diagnosis not present

## 2016-05-16 MED ORDER — LACOSAMIDE 200 MG PO TABS: 200.0000 mg | ORAL_TABLET | Freq: Two times a day (BID) | ORAL | 3 refills | Status: DC

## 2016-05-16 MED ORDER — KEPPRA 750 MG PO TABS: 1500.0000 mg | ORAL_TABLET | Freq: Two times a day (BID) | ORAL | 3 refills | Status: DC

## 2016-05-16 NOTE — Progress Notes (Signed)
NEUROLOGY FOLLOW UP OFFICE NOTE  Clayton Torres [id number]  HISTORY OF PRESENT ILLNESS: I had the pleasure of seeing Clayton Torres in follow-up in the neurology clinic on 05/16/2016.  The patient was last seen 6 months ago for recurrent seizures. He is again accompanied by his mother who helps supplement the history today.  Records and images were personally reviewed where available.  I personally reviewed MRI brain with and without contrast which did not show any acute changes. 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 There is a tiny developmental venous anomaly is noted in the anterior left frontal lobe. His 1-hour sleep-deprived EEG was abnormal with occasional focal slowing over the left posterior temporal region, occasional epileptiform discharges over the left posterior temporal region, a single sharp transient was seen over the right frontal region. He continues on Keppra 750mg  2 tabs BID and Vimpat 200mg  BID with no seizures since 10/15/15. He denies any side effects on higher dose of Vimpat. He and his mother deny any staring/unresponsive episodes, gaps in time, olfactory/gustatory hallucinations, deja vu, rising epigastric sensation, focal numbness/tingling/weakness, myoclonic jerks. His mother notices occasional memory issues.   HPI: This is a pleasant 25 yo LH man with a history of congenital hydrocephalus s/p VP shunt at age 74 months and shunt revision in 2001, hypopituitarism, and seizures since 2006. The first seizure occurred in August 2006 in the setting of the patient not eating and being in a very hot environment. He hit the left side of his head and had a witnessed GTC. He has no  recollection/warning that he could remember. He had an EEG at that time and was told they could "see where the spike was," and was started on Dilantin. He was switched by his neurologist in [place] to Keppra a few months after. He went over a year without any seizures and medication taper was discussed. Within 2 weeks, he had a seizure in October 2007 out of sleep. Keppra was resumed and he did well for almost 7 years without any seizures until January 2014 when he had another GTC and Keppra dose was increased. He had several seizures that year, in March 2014, June 2014, and October 2014. He recalls feeling dizzy with the seizure in 05/2013, he fell in the tub. He was taking Keppra 1500mg  BID and Oxtellar was added. He had hyponatremia with a sodium of 119 and Oxtellar was stopped. He was also tried on Aptiom which also caused hyponatremia. He was started on Vimpat in October 2014 and dose of 150mg  BID seemed to be the "magic combination" for him. His sodium levels stabilized between 135-138. Last Keppra level in March 2016 was 15. A Vimpat level in June 2015 was 29. He was again doing well with no seizures for almost 3 years until he had a witnessed convulsion on 10/15/15. He was at the computer writing a check, then his mother reported he did not answer her then turned red, followed by jerking and drooling for 5 minutes. No tongue bite or incontinence. After the seizure, he had sonorous respirations. He was also noted to break out in petechiae over his face. He was brought to [facility] ER where bloodwork where CBC and CMP were normal, Keppra  level was low at 4.8. He had a head CT without contrast which showed the right-sided transparietal VP catheter terminating just left of the midline, stable to prior study, no hydrocephalus. He had been seeing neurologist Dr. Lucia Gaskins and Vimpat dose was increased to 200mg  BID. Most recent drug levels on 10/2015 showed a Keppra level of 13.7 (ref 10-40) and Vimpat level of 6.3 (ref  5-10).  They report that he got less sleep the night prior to the recent seizure, with only 4-1/2 hours of sleep. He denied any missed medication doses, no recent infections or alcohol intake. His parents deny any episodes of staring/unresponsiveness. He denies any gaps in time, olfactory/gustatory hallucinations, deja vu, rising epigastric sensation, focal numbness/tingling/weakness, myoclonic jerks. He works at a [employer], he does not drive.   Epilepsy Risk Factors:  Patient was born premature at 7 months with congenital hydrocephalus s/p VP shunt. He has a history of panhypopituitarism. Otherwise no history of febrile convulsions, CNS infections such as meningitis/encephalitis, significant traumatic brain injury, or family history of seizures.  Prior AEDs: Dilantin, generic Keppra (side effects and impaired seizure control per prior records)  PAST MEDICAL HISTORY: Past Medical History:  Diagnosis Date  . Hydrocephalus   . Seizure disorder [facility])     MEDICATIONS: Current Outpatient Prescriptions on File Prior to Visit  Medication Sig Dispense Refill  . Aloe-Sodium Chloride (AYR SALINE NASAL GEL NA) Place 1 Dose into the nose daily.    Cathren Laine 4 MG/24HR PT24 patch Apply 4 mg topically daily.    . Ascorbic Acid (VITAMIN C) 1000 MG tablet Take 1,000 mg by mouth daily.    . Cholecalciferol (VITAMIN D-3 PO) Take 1,000 Units by mouth daily.    . ciclesonide (OMNARIS) 50 MCG/ACT nasal spray Place 2 sprays into both nostrils daily as needed for allergies.    . CORTEF 5 MG tablet Take 5 mg by mouth 2 (two) times daily. Will take 3 if increase stress    . Fexofenadine HCl (ALLEGRA PO) Take 180 mg by mouth daily. Every other day    . fexofenadine-pseudoephedrine (ALLEGRA-D 24) 180-240 MG 24 hr tablet Take 1 tablet by mouth. Every other day    . KEPPRA 750 MG tablet Take 2 tablets (1,500 mg total) by mouth 2 (two) times daily. 2 tabs 8am 2 tabs 8pm 120 tablet 11  . lacosamide  (VIMPAT) 200 MG TABS tablet Take 1 tablet (200 mg total) by mouth 2 (two) times daily. 60 tablet 5  . Multiple Vitamins tablet Take 1 tablet by mouth daily.    Marland Kitchen SYNTHROID 112 MCG tablet Take 112 mcg by mouth daily.     No current facility-administered medications on file prior to visit.     ALLERGIES: Allergies  Allergen Reactions  . Benzonatate Diarrhea  . Ceftriaxone Hives  . Eslicarbazepine Other (See Comments)    Low soduim  . Oxcarbazepine Other (See Comments)    Low soduim    FAMILY HISTORY: Family History  Problem Relation Age of Onset  . Hypertension    . Diabetes    . CAD    . COPD    . Cancer Paternal Grandmother   . Cancer Paternal Grandfather     SOCIAL HISTORY: Social History   Social History  . Marital status: Single    Spouse name: N/A  . Number of children: N/A  . 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  Smoking status: Never Smoker  . Smokeless tobacco: Never Used  . Alcohol use No  . Drug use: No  . Sexual activity: Not on file   Other Topics Concern  . Not on file   Social History Narrative   Lives w/ parents   Caffeine use: 1 cup coffee per day      PA's: [phone]       REVIEW OF SYSTEMS: Constitutional: No fevers, chills, or sweats, no generalized fatigue, change in appetite Eyes: No visual changes, double vision, eye pain Ear, nose and throat: No hearing loss, ear pain, nasal congestion, sore throat Cardiovascular: No chest pain, palpitations Respiratory:  No shortness of breath at rest or with exertion, wheezes GastrointestinaI: No nausea, vomiting, diarrhea, abdominal pain, fecal incontinence Genitourinary:  No dysuria, urinary retention or frequency Musculoskeletal:  No neck pain, back pain Integumentary: No rash, pruritus, skin lesions Neurological: as above Psychiatric: No depression, insomnia, anxiety Endocrine: No palpitations, fatigue,  diaphoresis, mood swings, change in appetite, change in weight, increased thirst Hematologic/Lymphatic:  No anemia, purpura, petechiae. Allergic/Immunologic: no itchy/runny eyes, nasal congestion, recent allergic reactions, rashes  PHYSICAL EXAM: Vitals:   05/16/16 1406  BP: 122/70  Pulse: 89  Temp: 97.9 F (36.6 C)   General: No acute distress Head:  Normocephalic/atraumatic Neck: supple, no paraspinal tenderness, full range of motion Heart:  Regular rate and rhythm Lungs:  Clear to auscultation bilaterally Back: No paraspinal tenderness Skin/Extremities: No rash, no edema Neurological Exam: alert and oriented to person, place, and time. No aphasia or dysarthria. Fund of knowledge is appropriate.  Recent and remote memory are intact. 2/3 delayed recall. Attention and concentration are normal.    Able to name objects and repeat phrases. Cranial nerves: Pupils equal, round, reactive to light. Extraocular movements intact with no nystagmus. Visual fields full. Facial sensation intact. No facial asymmetry. Tongue, uvula, palate midline.  Motor: Bulk and tone normal, muscle strength 5/5 throughout with no pronator drift.  Sensation to light touch intact.  No extinction to double simultaneous stimulation.  Deep tendon reflexes 2+ throughout, toes downgoing.  Finger to nose testing intact.  Gait narrow-based and steady, able to tandem walk adequately.  Romberg negative.  IMPRESSION: This is a pleasant 25 yo LH man with a history of congenital hydrocephalus s/p VP shunt, panhypopituitarism, and focal to bilateral tonic-clonic epilepsy since 2006. All his seizures have been convulsive seizures. 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 No further seizures since March 2017, he is on Keppra 1500mg  BID and Vimpat 200mg  BID. We discussed other treatment options for  epilepsy if seizures become difficult to control on medication (RNS, laser ablation). Continue on current medications for now. He does not drive and is aware of [place] driving laws to stop driving after a seizure until 6 months seizure-free. He will follow-up in 3 months and knows to call our office for any problems  Thank you for allowing me to participate in his care.  Please do not hesitate to call for any questions or concerns.  The duration of this appointment visit was 25 minutes of face-to-face time with the patient.  Greater than 50% of this time was spent in counseling, explanation of diagnosis, planning of further management, and coordination of care.   Patrcia DollyKaren Aquino, M.D.   CC: Dr. Merleen MillinerWinfield

## 2016-05-16 NOTE — Patient Instructions (Signed)
1. Continue all your current medications 2. Follow-up in 3 months, call for any changes  Seizure Precautions: 1. If medication has been prescribed for you to prevent seizures, take it exactly as directed.  Do not stop taking the medicine without talking to your doctor first, even if you have not had a seizure in a long time.   2. Avoid activities in which a seizure would cause danger to yourself or to others.  Don't operate dangerous machinery, swim alone, or climb in high or dangerous places, such as on ladders, roofs, or girders.  Do not drive unless your doctor says you may.  3. If you have any warning that you may have a seizure, lay down in a safe place where you can't hurt yourself.    4.  No driving for 6 months from last seizure, [place].   Please refer to the following link on the [employer]ile[employer]'s website for more information: http://www.epilepsyfoundation.org/answerplace/Social/driving/drivingu.cfm   5.  Maintain good sleep hygiene. Avoid alcohol  6.  Contact your doctor if you have any problems that may be related to the medicine you are taking.  7.  Call 911 and bring the patient back to the ED if:        A.  The seizure lasts longer than 5 minutes.       B.  The patient doesn't awaken shortly after the seizure  C.  The patient has new problems such as difficulty seeing, speaking or moving  D.  The patient was injured during the seizure  E.  The patient has a temperature over 102 F (39C)  F.  The patient vomited and now is having trouble breathing

## 2016-05-18 ENCOUNTER — Encounter: Payer: Self-pay | Admitting: Neurology

## 2016-05-24 ENCOUNTER — Encounter: Payer: Self-pay | Admitting: Neurology

## 2016-07-14 ENCOUNTER — Encounter: Payer: Self-pay | Admitting: Neurology

## 2016-07-27 ENCOUNTER — Encounter: Payer: Self-pay | Admitting: Neurology

## 2016-08-10 ENCOUNTER — Telehealth: Payer: Self-pay

## 2016-08-10 NOTE — Telephone Encounter (Signed)
Contacted patients mother to see if insurance forms were approved so I could send RX refill for Keppra. Mother states they are being reviewed and she will contact office when she needs refill sent. She states patient has enough Keppra to do for now.

## 2016-08-15 ENCOUNTER — Encounter: Payer: Self-pay | Admitting: Neurology

## 2016-08-23 ENCOUNTER — Ambulatory Visit (INDEPENDENT_AMBULATORY_CARE_PROVIDER_SITE_OTHER): Payer: Self-pay | Admitting: Neurology

## 2016-08-23 ENCOUNTER — Encounter: Payer: Self-pay | Admitting: Neurology

## 2016-08-23 VITALS — BP 118/66 | HR 111 | Ht 70.0 in | Wt 199.4 lb

## 2016-08-23 DIAGNOSIS — Q039 Congenital hydrocephalus, unspecified: Secondary | ICD-10-CM

## 2016-08-23 DIAGNOSIS — G40209 Localization-related (focal) (partial) symptomatic epilepsy and epileptic syndromes with complex partial seizures, not intractable, without status epilepticus: Secondary | ICD-10-CM

## 2016-08-23 NOTE — Patient Instructions (Signed)
1. Continue all your medications 2. Call our office for any problems, follow-up in 4 months  Seizure Precautions: 1. If medication has been prescribed for you to prevent seizures, take it exactly as directed.  Do not stop taking the medicine without talking to your doctor first, even if you have not had a seizure in a long time.   2. Avoid activities in which a seizure would cause danger to yourself or to others.  Don't operate dangerous machinery, swim alone, or climb in high or dangerous places, such as on ladders, roofs, or girders.  Do not drive unless your doctor says you may.  3. If you have any warning that you may have a seizure, lay down in a safe place where you can't hurt yourself.    4.  No driving for 6 months from last seizure, as per [place] [facility] [place] law.   Please refer to the following link on the [employer]ile[employer]'s website for more information: http://www.epilepsyfoundation.org/answerplace/Social/driving/drivingu.cfm   5.  Maintain good sleep hygiene. Avoid alcohol.  6.  Contact your doctor if you have any problems that may be related to the medicine you are taking.  7.  Call 911 and bring the patient back to the ED if:        A.  The seizure lasts longer than 5 minutes.       B.  The patient doesn't awaken shortly after the seizure  C.  The patient has new problems such as difficulty seeing, speaking or moving  D.  The patient was injured during the seizure  E.  The patient has a temperature over 102 F (39C)  F.  The patient vomited and now is having trouble breathing

## 2016-08-23 NOTE — Progress Notes (Signed)
NEUROLOGY FOLLOW UP OFFICE NOTE  Clayton Torres [id number]  HISTORY OF PRESENT ILLNESS: I had the pleasure of seeing Clayton Torres in follow-up in the neurology clinic on 08/23/2016. The patient was last seen 3 months ago for recurrent seizures. He is again accompanied by his parents who helps supplement the history today. Since his last visit, he continues to do well with no seizures since 10/15/15 on Keppra 750mg  2 tabs BID and Vimpat 200mg  BID. No side effects. He denies any staring/unresponsive episodes, gaps in time, olfactory/gustatory hallucinations, deja vu, rising epigastric sensation, focal numbness/tingling/weakness, myoclonic jerks. He denies any headaches, dizziness, diplopia, focal numbness/tingling/weakness, no falls.   HPI: This is a pleasant 26 yo LH man with a history of congenital hydrocephalus s/p VP shunt at age 58 months and shunt revision in 2001, hypopituitarism, and seizures since 2006. The first seizure occurred in August 2006 in the setting of the patient not eating and being in a very hot environment. He hit the left side of his head and had a witnessed GTC. He has no recollection/warning that he could remember. He had an EEG at that time and was told they could "see where the spike was," and was started on Dilantin. He was switched by his neurologist in [place] to Keppra a few months after. He went over a year without any seizures and medication taper was discussed. Within 2 weeks, he had a seizure in October 2007 out of sleep. Keppra was resumed and he did well for almost 7 years without any seizures until January 2014 when he had another GTC and Keppra dose was increased. He had several seizures that year, in March 2014, June 2014, and October 2014. He recalls feeling dizzy with the seizure in 05/2013, he fell in the tub. He was taking Keppra 1500mg  BID and Oxtellar was added. He had hyponatremia with a sodium of 119 and Oxtellar was stopped. He was also tried on Aptiom which also  caused hyponatremia. He was started on Vimpat in October 2014 and dose of 150mg  BID seemed to be the "magic combination" for him. His sodium levels stabilized between 135-138. Last Keppra level in March 2016 was 15. A Vimpat level in June 2015 was 29. He was again doing well with no seizures for almost 3 years until he had a witnessed convulsion on 10/15/15. He was at the computer writing a check, then his mother reported he did not answer her then turned red, followed by jerking and drooling for 5 minutes. No tongue bite or incontinence. After the seizure, he had sonorous respirations. He was also noted to break out in petechiae over his face. He was brought to [facility] ER where bloodwork where CBC and CMP were normal, Keppra level was low at 4.8. He had a head CT without contrast which showed the right-sided transparietal VP catheter terminating just left of the midline, stable to prior study, no hydrocephalus. He had been seeing neurologist Dr. Lucia Gaskins and Vimpat dose was increased to 200mg  BID. Most recent drug levels on 10/2015 showed a Keppra level of 13.7 (ref 10-40) and Vimpat level of 6.3 (ref 5-10).  They report that he got less sleep the night prior to the recent seizure, with only 4-1/2 hours of sleep. He denied any missed medication doses, no recent infections or alcohol intake. His parents deny any episodes of staring/unresponsiveness. He denies any gaps in time, olfactory/gustatory hallucinations, deja vu, rising epigastric sensation, focal numbness/tingling/weakness, myoclonic jerks. He works at a [employer],  he does not drive.   Epilepsy Risk Factors:  Patient was born premature at 7 months with congenital hydrocephalus s/p VP shunt. He has a history of panhypopituitarism. Otherwise no history of febrile convulsions, CNS infections such as meningitis/encephalitis, significant traumatic brain injury, or family history of seizures.  Diagnostic Data: I personally reviewed MRI brain with and  without contrast which did not show any acute changes. 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 There is a tiny developmental venous anomaly is noted in the anterior left frontal lobe. His 1-hour sleep-deprived EEG was abnormal with occasional focal slowing over the left posterior temporal region, occasional epileptiform discharges over the left posterior temporal region, a single sharp transient was seen over the right frontal region.   Prior AEDs: Dilantin, generic Keppra (side effects and impaired seizure control per prior records)  PAST MEDICAL HISTORY: Past Medical History:  Diagnosis Date  . Hydrocephalus   . Seizure disorder [facility](HCC)     MEDICATIONS: Current Outpatient Prescriptions on File Prior to Visit  Medication Sig Dispense Refill  . Aloe-Sodium Chloride (AYR SALINE NASAL GEL NA) Place 1 Dose into the nose daily.    Cathren Laine. ANDRODERM 4 MG/24HR PT24 patch Apply 4 mg topically daily.    . Ascorbic Acid (VITAMIN C) 1000 MG tablet Take 1,000 mg by mouth daily.    . Cholecalciferol (VITAMIN D-3 PO) Take 1,000 Units by mouth daily.    . ciclesonide (OMNARIS) 50 MCG/ACT nasal spray Place 2 sprays into both nostrils daily as needed for allergies.    . CORTEF 5 MG tablet Take 5 mg by mouth 2 (two) times daily. Will take 3 if increase stress    . Fexofenadine HCl (ALLEGRA PO) Take 180 mg by mouth daily. Every other day    . fexofenadine-pseudoephedrine (ALLEGRA-D 24) 180-240 MG 24 hr tablet Take 1 tablet by mouth. Every other day    . KEPPRA 750 MG tablet Take 2 tablets (1,500 mg total) by mouth 2 (two) times daily. 2 tabs 8am 2 tabs 8pm 360 tablet 3  . lacosamide (VIMPAT) 200 MG TABS  tablet Take 1 tablet (200 mg total) by mouth 2 (two) times daily. 180 tablet 3  . Multiple Vitamins tablet Take 1 tablet by mouth daily.    Marland Kitchen. SYNTHROID 112 MCG tablet Take 112 mcg by mouth daily.     No current facility-administered medications on file prior to visit.     ALLERGIES: Allergies  Allergen Reactions  . Benzonatate Diarrhea  . Ceftriaxone Hives  . Eslicarbazepine Other (See Comments)    Low soduim  . Oxcarbazepine Other (See Comments)    Low soduim    FAMILY HISTORY: Family History  Problem Relation Age of Onset  . Hypertension    . Diabetes    . CAD    . COPD    . Cancer Paternal Grandmother   . Cancer Paternal Grandfather     SOCIAL HISTORY: Social History   Social History  . Marital status: Single    Spouse name: N/A  . Number of children: N/A  . Years of education: N/A   Occupational History  . 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 Smoking status: Never Smoker  .  Smokeless tobacco: Never Used  . Alcohol use No  . Drug use: No  . Sexual activity: Not on file   Other Topics Concern  . Not on file   Social History Narrative   Lives w/ parents   Caffeine use: 1 cup coffee per day      PA's: [phone]       REVIEW OF SYSTEMS: Constitutional: No fevers, chills, or sweats, no generalized fatigue, change in appetite Eyes: No visual changes, double vision, eye pain Ear, nose and throat: No hearing loss, ear pain, nasal congestion, sore throat Cardiovascular: No chest pain, palpitations Respiratory:  No shortness of breath at rest or with exertion, wheezes GastrointestinaI: No nausea, vomiting, diarrhea, abdominal pain, fecal incontinence Genitourinary:  No dysuria, urinary retention or frequency Musculoskeletal:  No neck pain, back pain Integumentary: No rash, pruritus, skin lesions Neurological: as above Psychiatric: No depression, insomnia, anxiety Endocrine: No palpitations, fatigue, diaphoresis, mood swings,  change in appetite, change in weight, increased thirst Hematologic/Lymphatic:  No anemia, purpura, petechiae. Allergic/Immunologic: no itchy/runny eyes, nasal congestion, recent allergic reactions, rashes  PHYSICAL EXAM: Vitals:   08/23/16 1528  BP: 118/66  Pulse: (!) 111   General: No acute distress Head:  Normocephalic/atraumatic Neck: supple, no paraspinal tenderness, full range of motion Heart:  Regular rate and rhythm Lungs:  Clear to auscultation bilaterally Back: No paraspinal tenderness Skin/Extremities: No rash, no edema Neurological Exam: alert and oriented to person, place, and time. No aphasia or dysarthria. Fund of knowledge is appropriate.  Recent and remote memory are intact. Attention and concentration are normal.    Able to name objects and repeat phrases. Cranial nerves: Pupils equal, round, reactive to light. Extraocular movements intact with no nystagmus. Visual fields full. Facial sensation intact. No facial asymmetry. Tongue, uvula, palate midline.  Motor: Bulk and tone normal, muscle strength 5/5 throughout with no pronator drift.  Sensation to light touch intact.  No extinction to double simultaneous stimulation.  Deep tendon reflexes 2+ throughout, toes downgoing.  Finger to nose testing intact.  Gait narrow-based and steady, able to tandem walk adequately.  Romberg negative.  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 All his seizures have been convulsive seizures. 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 No further seizures since March 2017, he is on Keppra 1500mg  BID and Vimpat 200mg  BID with no side effects. Continue on current medications for now. He does not drive and is aware of [place] driving laws  to stop driving after a seizure until 6 months seizure-free. We discussed seizure precautions. He will follow-up in 4 months and knows to call our office for any problems  Thank you for allowing me to participate in his care.  Please do not hesitate to call for any questions or concerns.  The duration of this appointment visit was 15 minutes of face-to-face time with the patient.  Greater than 50% of this time was spent in counseling, explanation of diagnosis, planning of further management, and coordination of care.   Patrcia Dolly, M.D.   CC: Dr. Tiburcio Pea

## 2016-08-24 ENCOUNTER — Encounter: Payer: Self-pay | Admitting: Neurology

## 2016-08-31 ENCOUNTER — Encounter: Payer: Self-pay | Admitting: Neurology

## 2016-09-01 ENCOUNTER — Encounter: Payer: Self-pay | Admitting: Neurology

## 2016-09-06 ENCOUNTER — Encounter: Payer: Self-pay | Admitting: Neurology

## 2016-09-06 ENCOUNTER — Telehealth: Payer: Self-pay | Admitting: Neurology

## 2016-09-06 NOTE — Telephone Encounter (Signed)
Clayton Torres 28-Jan-1991. His # [phone]. Clayton Torres Clayton Torres's mom called Keppra is going to be $1000 to pick up. He takes 4 a day. She said she know he is to stay on name brand. They are DENYING  it. She got a letter the other day for an appeal. She is needing Dr. Karel JarvisAquino to maybe do an URGENT APPEAL. He will run out of the Keppra on 09/20/16. Why it should be approved and what would happen if he took Generic. Also any office notes to why it should be approved. The  # For appeal [phone] [facility] [employer] Appeals Department. His Member ID #  [id number]. Thank you

## 2016-09-06 NOTE — Telephone Encounter (Signed)
Please advise 

## 2016-09-07 ENCOUNTER — Encounter: Payer: Self-pay | Admitting: Neurology

## 2016-09-08 ENCOUNTER — Telehealth: Payer: Self-pay

## 2016-09-08 NOTE — Telephone Encounter (Signed)
Mother states for us to call back on cell number [phone]

## 2016-09-08 NOTE — Telephone Encounter (Signed)
Spoke with patients mother. She asks what is advised for patients Keppra denial. She is scared to take him off of the brand name so they are trying to get him a job so insurance will change. Mother states it will be 3 months before that option will be confirmed and family and friends are willing to help them so he can keep brand name medication. She states if you advise the generic they are thinking of trying that as well. Please advise.

## 2016-09-08 NOTE — Telephone Encounter (Signed)
Appeal sent 09/06/16. Received denial of appeal on 09/08/16.

## 2016-09-09 NOTE — Telephone Encounter (Signed)
Spoke to mother about her anxiety/concern about switching from brand to generic, had an extensive discussion that overall data shows switching should be fine. She states they can help him with paying out of pocket for the next 3 months ($5000!), discussed this is a lot of money and recommend they think about this if this is the route they want to go. After an extensive discussion, mother agrees to switch to generic. We was repeatedly reassured we will continue to monitor [facility].

## 2016-09-15 ENCOUNTER — Encounter: Payer: Self-pay | Admitting: Neurology

## 2016-10-10 ENCOUNTER — Encounter: Payer: Self-pay | Admitting: Neurology

## 2016-10-13 ENCOUNTER — Encounter: Payer: Self-pay | Admitting: Neurology

## 2016-10-13 ENCOUNTER — Telehealth: Payer: Self-pay | Admitting: Neurology

## 2016-10-13 NOTE — Telephone Encounter (Signed)
Sorry the computer shut down in the middle of the message. He will get us the name of the name and fax number to the letter to you. Patient phone number is [phone]

## 2016-10-13 NOTE — Telephone Encounter (Signed)
Patient states that we will need to email this letter to [email or web address]

## 2016-10-13 NOTE — Telephone Encounter (Signed)
Any neurologic reason he could not have this?

## 2016-10-13 NOTE — Telephone Encounter (Signed)
Patient wants to know if he can have the TDAP for his job at [employer]. They said if he could not it would be ok they just need a letter states if he can or can not have it. He will get us the person name and fa

## 2016-10-14 NOTE — Telephone Encounter (Signed)
I sent him a message on MyChart, I told him studies have shown that DTaP does not increase risk for seizures (http://webb-evans.org/https://www.jwatch.org/pa201008250000001/2009/04/01/dtap-not-associated-with-seizures). From a neurological and medical standpoint, since he is exposed to patients, it would be better he have the booster. But if he declines it, he just needs to check off the box that he declines.

## 2016-10-18 ENCOUNTER — Encounter: Payer: Self-pay | Admitting: Neurology

## 2016-11-09 ENCOUNTER — Encounter: Payer: Self-pay | Admitting: Neurology

## 2016-11-09 DIAGNOSIS — G40209 Localization-related (focal) (partial) symptomatic epilepsy and epileptic syndromes with complex partial seizures, not intractable, without status epilepticus: Secondary | ICD-10-CM

## 2016-11-16 MED ORDER — LACOSAMIDE 200 MG PO TABS: 200.0000 mg | ORAL_TABLET | Freq: Two times a day (BID) | ORAL | 3 refills | Status: DC

## 2016-11-16 MED ORDER — LEVETIRACETAM 750 MG PO TABS: 1500.0000 mg | ORAL_TABLET | Freq: Two times a day (BID) | ORAL | 3 refills | Status: DC

## 2016-11-23 ENCOUNTER — Telehealth: Payer: Self-pay | Admitting: Neurology

## 2016-11-23 DIAGNOSIS — G40209 Localization-related (focal) (partial) symptomatic epilepsy and epileptic syndromes with complex partial seizures, not intractable, without status epilepticus: Secondary | ICD-10-CM

## 2016-11-23 MED ORDER — LACOSAMIDE 200 MG PO TABS: 200.0000 mg | ORAL_TABLET | Freq: Two times a day (BID) | ORAL | 3 refills | Status: DC

## 2016-11-23 MED FILL — VIMPAT 200 MG TABLET: 200 | 30 days supply | Qty: 60 | Fill #0

## 2016-11-23 NOTE — Telephone Encounter (Signed)
Patient mother called and states that the drug store faxed over a prior auth for the vimpat medication. She wanted to make sure we had gotten it and also to let us know that he would be out of medication on Monday April 23 please call her back at [phone]

## 2016-11-23 NOTE — Telephone Encounter (Signed)
Received prior auth from [employer].   Will forward for Dr. Karel Jarvis to complete.

## 2016-11-23 NOTE — Telephone Encounter (Signed)
Received a call from PT's mother regarding medication: Vimpat.  Patient needs a refill of medication. Yes  Patient having side effects from medication. No  Patient calling to update [place] on medication. [facility]

## 2016-11-23 NOTE — Telephone Encounter (Signed)
Cll pt - DPR checked - spoke to pt's mom, Clayton Torres  Per the email between the pt and Dr. Karel Jarvis - mom is now requesting Rx for Vimpat to be electing sent to [facility] due to $118 difference of cost.   Advsd Mom due to provider not available, I would process Rx and have a provider that will be in the office on tomorrow, Thursday, 11/24/16, to sign off for the medication refill if necessary.   Also advsd Mom that I have not received a prior auth for Vimpat from [facility] - she stated she would call them and have the prior auth faxed over for Dr. Karel Jarvis to complete on Tuesday, 11/29/16.  Called in medication refill for Lacosamide ( Vimpat)  200 mg 1 tablet by mouth 2 times daily #180 RF 3 to Donna/ [facility], [phone].

## 2016-11-27 ENCOUNTER — Encounter: Payer: Self-pay | Admitting: Neurology

## 2016-11-28 ENCOUNTER — Encounter: Payer: Self-pay | Admitting: Neurology

## 2016-11-29 ENCOUNTER — Encounter: Payer: Self-pay | Admitting: Neurology

## 2016-11-29 ENCOUNTER — Telehealth: Payer: Self-pay | Admitting: Neurology

## 2016-11-29 NOTE — Telephone Encounter (Signed)
Called [phone] for prior authorization intake for Vimpat  BID, stated that he has had a prior trial of generic levetiracetam and oxcarbazepine. Answered questions from agent Nona Dell. We will hear something within 72 hours by fax.

## 2016-12-07 ENCOUNTER — Encounter: Payer: Self-pay | Admitting: Neurology

## 2016-12-12 MED FILL — SYNTHROID 112 MCG TABLET: 112 | 30 days supply | Qty: 30 | Fill #0

## 2016-12-21 MED FILL — VIMPAT 200 MG TABLET: 200 | 30 days supply | Qty: 60 | Fill #1

## 2016-12-23 DIAGNOSIS — E162 Hypoglycemia, unspecified: Secondary | ICD-10-CM | POA: Diagnosis not present

## 2016-12-27 MED FILL — HYDROCORTISONE 5 MG TABLET: 5 | 90 days supply | Qty: 180 | Fill #0

## 2016-12-29 DIAGNOSIS — E162 Hypoglycemia, unspecified: Secondary | ICD-10-CM | POA: Diagnosis not present

## 2017-01-04 MED FILL — LIDOCAINE-PRILOCAINE CREAM: 2.5-2.5 | 15 days supply | Qty: 30 | Fill #0

## 2017-01-11 MED FILL — SYNTHROID 112 MCG TABLET: 112 | 30 days supply | Qty: 30 | Fill #1

## 2017-01-16 ENCOUNTER — Encounter: Payer: Self-pay | Admitting: Neurology

## 2017-01-16 ENCOUNTER — Ambulatory Visit (INDEPENDENT_AMBULATORY_CARE_PROVIDER_SITE_OTHER): Payer: 59 | Admitting: Neurology

## 2017-01-16 VITALS — BP 116/76 | HR 79 | Ht 71.0 in | Wt 195.0 lb

## 2017-01-16 DIAGNOSIS — J019 Acute sinusitis, unspecified: Secondary | ICD-10-CM | POA: Diagnosis not present

## 2017-01-16 DIAGNOSIS — G40209 Localization-related (focal) (partial) symptomatic epilepsy and epileptic syndromes with complex partial seizures, not intractable, without status epilepticus: Secondary | ICD-10-CM | POA: Diagnosis not present

## 2017-01-16 DIAGNOSIS — Q039 Congenital hydrocephalus, unspecified: Secondary | ICD-10-CM

## 2017-01-16 MED ORDER — LACOSAMIDE 200 MG PO TABS: 200.0000 mg | ORAL_TABLET | Freq: Two times a day (BID) | ORAL | 3 refills | Status: DC

## 2017-01-16 MED ORDER — LEVETIRACETAM 750 MG PO TABS: 1500.0000 mg | ORAL_TABLET | Freq: Two times a day (BID) | ORAL | 3 refills | Status: DC

## 2017-01-16 MED FILL — LIDOCAINE 2% VISCOUS SOLN: 2 | 2 days supply | Qty: 100 | Fill #0

## 2017-01-16 MED FILL — AMOX-CLAV 875-125 MG TABLET: 875-125 | 7 days supply | Qty: 14 | Fill #0

## 2017-01-16 NOTE — Patient Instructions (Signed)
1. Continue VImpat 200mg  twice a day and Levetiracetam 750mg  2 tablets twice a day 2. Follow-up in 6 months, call for any changes  Seizure Precautions: 1. If medication has been prescribed for you to prevent seizures, take it exactly as directed.  Do not stop taking the medicine without talking to your doctor first, even if you have not had a seizure in a long time.   2. Avoid activities in which a seizure would cause danger to yourself or to others.  Don't operate dangerous machinery, swim alone, or climb in high or dangerous places, such as on ladders, roofs, or girders.  Do not drive unless your doctor says you may.  3. If you have any warning that you may have a seizure, lay down in a safe place where you can't hurt yourself.    4.  No driving for 6 months from last seizure, as [place].   Please refer to the following link on the Epilepsy Foundation of America's website for more information: http://www.epilepsyfoundation.org/answerplace/Social/driving/drivingu.cfm   5.  Maintain good sleep hygiene. Avoid alcohol.  6.  Contact your doctor if you have any problems that may be related to the medicine you are taking.  7.  Call 911 and bring the patient back to the ED if:        A.  The seizure lasts longer than 5 minutes.       B.  The patient doesn't awaken shortly after the seizure  C.  The patient has new problems such as difficulty seeing, speaking or moving  D.  The patient was injured during the seizure  E.  The patient has a temperature over 102 F (39C)  F.  The patient vomited and now is having trouble breathing

## 2017-01-16 NOTE — Progress Notes (Signed)
NEUROLOGY FOLLOW UP OFFICE NOTE  Clayton Torres [id number]  HISTORY OF PRESENT ILLNESS: I had the pleasure of seeing Clayton Torres in follow-up in the neurology clinic on 01/16/2017. The patient was last seen 5 months ago for recurrent seizures. He is again accompanied by his parents who helps supplement the history today. Since his last visit, he continues to do well with no seizures since 10/15/15. There was some insurance issues, and after several phone calls and letters, he is taking Levetiracetam 750mg  2 tabs BID and continues on Vimpat 200mg  BID with no side effects.  He denies any staring/unresponsive episodes, gaps in time, olfactory/gustatory hallucinations, deja vu, rising epigastric sensation, focal numbness/tingling/weakness, myoclonic jerks. He denies any headaches, dizziness, diplopia, focal numbness/tingling/weakness, no falls. He has a sinus infection, switched today from Amoxicillin to Augmentin. 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  HPI: This is a pleasant 26 yo LH man with a history of congenital hydrocephalus s/p VP shunt at age 24 months and shunt revision in 2001, hypopituitarism, and seizures since 2006. The first seizure occurred in August 2006 in the setting of the patient not eating and being in a very hot environment. He hit the left side of his head and had a witnessed GTC. He has no recollection/warning that he could remember. He had an EEG at that time and was told they could "see where the spike was," and was started on Dilantin. He was switched by his neurologist in [place] to Keppra a few months after. He went over a year without any seizures and medication taper was discussed. Within 2 weeks, he had a seizure in October 2007 out of sleep. Keppra was resumed and he did well for almost 7 years without any seizures until January 2014 when he had another GTC and Keppra dose was increased. He had several seizures that year, in March 2014, June 2014,  and October 2014. He recalls feeling dizzy with the seizure in 05/2013, he fell in the tub. He was taking Keppra 1500mg  BID and Oxtellar was added. He had hyponatremia with a sodium of 119 and Oxtellar was stopped. He was also tried on Aptiom which also caused hyponatremia. He was started on Vimpat in October 2014 and dose of 150mg  BID seemed to be the "magic combination" for him. His sodium levels stabilized between 135-138. Last Keppra level in March 2016 was 15. A Vimpat level in June 2015 was 29. He was again doing well with no seizures for almost 3 years until he had a witnessed convulsion on 10/15/15. He was at the computer writing a check, then his mother reported he did not answer her then turned red, followed by jerking and drooling for 5 minutes. No tongue bite or incontinence. After the seizure, he had sonorous respirations. He was also noted to break out in petechiae over his face. He was brought to [facility] ER where bloodwork where CBC and CMP were normal, Keppra level was low at 4.8. He had a head CT without contrast which showed the right-sided transparietal VP catheter terminating just left of the midline, stable to prior study, no hydrocephalus. He had been seeing neurologist Dr. Lucia Gaskins and Vimpat dose was increased to 200mg  BID. Most recent drug levels on 10/2015 showed a Keppra level of 13.7 (ref 10-40) and Vimpat level of 6.3 (ref 5-10).  They report that he got less sleep the night prior to the recent seizure, with only 4-1/2 hours of sleep. He denied  any missed medication doses, no recent infections or alcohol intake. His parents deny any episodes of staring/unresponsiveness. He denies any gaps in time, olfactory/gustatory hallucinations, deja vu, rising epigastric sensation, focal numbness/tingling/weakness, myoclonic jerks. He works at a [employer], he does not drive.   Epilepsy Risk Factors:  Patient was born premature at 7 months with congenital hydrocephalus s/p VP shunt. He has  a history of panhypopituitarism. Otherwise no history of febrile convulsions, CNS infections such as meningitis/encephalitis, significant traumatic brain injury, or family history of seizures.  Diagnostic Data: I personally reviewed MRI brain with and without contrast which did not show any acute changes. 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 There is a tiny developmental venous anomaly is noted in the anterior left frontal lobe. His 1-hour sleep-deprived EEG was abnormal with occasional focal slowing over the left posterior temporal region, occasional epileptiform discharges over the left posterior temporal region, a single sharp transient was seen over the right frontal region.   Prior AEDs: Dilantin, generic Keppra (side effects and impaired seizure control per prior records)  PAST MEDICAL HISTORY: Past Medical History:  Diagnosis Date  . Hydrocephalus   . Seizure disorder [facility])     MEDICATIONS: Current Outpatient Prescriptions on File Prior to Visit  Medication Sig Dispense Refill  . Aloe-Sodium Chloride (AYR SALINE NASAL GEL NA) Place 1 Dose into the nose daily.    . Ascorbic Acid (VITAMIN C) 1000 MG tablet Take 1,000 mg by mouth daily.    . Cholecalciferol (VITAMIN D-3 PO) Take 1,000 Units by mouth daily.    . CORTEF 5 MG tablet Take 5 mg by mouth 2 (two) times daily. Will take 3 if increase stress    . Fexofenadine HCl (ALLEGRA PO) Take 180 mg by mouth daily. Every other day    . fexofenadine-pseudoephedrine (ALLEGRA-D 24) 180-240 MG 24 hr tablet Take 1 tablet by mouth. Every other day    . lacosamide (VIMPAT) 200 MG TABS tablet Take 1 tablet (200 mg total) by mouth 2  (two) times daily. 180 tablet 3  . levETIRAcetam (KEPPRA) 750 MG tablet Take 2 tablets (1,500 mg total) by mouth 2 (two) times daily. 2 tabs 8am 2 tabs 8pm 360 tablet 3  . Multiple Vitamins tablet Take 1 tablet by mouth daily.    Marland Kitchen SYNTHROID 112 MCG tablet Take 112 mcg by mouth daily.    Cathren Laine 4 MG/24HR PT24 patch Apply 4 mg topically daily.    . ciclesonide (OMNARIS) 50 MCG/ACT nasal spray Place 2 sprays into both nostrils daily as needed for allergies.     No current facility-administered medications on file prior to visit.     ALLERGIES: Allergies  Allergen Reactions  . Benzonatate Diarrhea  . Ceftriaxone Hives  . Eslicarbazepine Other (See Comments)    Low soduim  . Oxcarbazepine Other (See Comments)    Low soduim    FAMILY HISTORY: Family History  Problem Relation Age of Onset  . Hypertension Unknown   . Diabetes Unknown   . CAD Unknown   . COPD Unknown   . Cancer Paternal Grandmother   . Cancer Paternal Grandfather     SOCIAL HISTORY: Social History   Social History  . Marital  status: Single    Spouse name: N/A  . Number of children: N/A  . 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 Smoking status: Never Smoker  . Smokeless tobacco: Never Used  . Alcohol use No  . Drug use: No  . Sexual activity: Not on file   Other Topics Concern  . Not on file   Social History Narrative   Lives w/ parents   Caffeine use: 1 cup coffee per day      PA's: [id number]       REVIEW OF SYSTEMS: Constitutional: No fevers, chills, or sweats, no generalized fatigue, change in appetite Eyes: No visual changes, double vision, eye pain Ear, nose and throat: No hearing loss, ear pain, nasal congestion, sore throat Cardiovascular: No chest pain, palpitations Respiratory:  No shortness of breath at rest or with exertion, wheezes GastrointestinaI: No nausea, vomiting, diarrhea, abdominal pain, fecal  incontinence Genitourinary:  No dysuria, urinary retention or frequency Musculoskeletal:  No neck pain, back pain Integumentary: No rash, pruritus, skin lesions Neurological: as above Psychiatric: No depression, insomnia, anxiety Endocrine: No palpitations, fatigue, diaphoresis, mood swings, change in appetite, change in weight, increased thirst Hematologic/Lymphatic:  No anemia, purpura, petechiae. Allergic/Immunologic: no itchy/runny eyes, nasal congestion, recent allergic reactions, rashes  PHYSICAL EXAM: Vitals:   01/16/17 1451  BP: 116/76  Pulse: 79   General: No acute distress Head:  Normocephalic/atraumatic Neck: supple, no paraspinal tenderness, full range of motion Heart:  Regular rate and rhythm Lungs:  Clear to auscultation bilaterally Back: No paraspinal tenderness Skin/Extremities: No rash, no edema Neurological Exam: alert and oriented to person, place, and time. No aphasia or dysarthria. Fund of knowledge is appropriate.  Recent and remote memory are intact. 3/3 delayed recall. Attention and concentration are normal.    Able to name objects and repeat phrases. Cranial nerves: Pupils equal, round, reactive to light. Extraocular movements intact with no nystagmus. Visual fields full. Facial sensation intact. No facial asymmetry. Tongue, uvula, palate midline.  Motor: Bulk and tone normal, muscle strength 5/5 throughout with no pronator drift.  Sensation to light touch intact.  No extinction to double simultaneous stimulation.  Deep tendon reflexes 2+ throughout, toes downgoing.  Finger to nose testing intact.  Gait narrow-based and steady, able to tandem walk adequately.  Romberg negative.  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 All his seizures have been convulsive seizures. MRI brain showed bilateral subependymal heterotopia left greater  than right, EEG showed left posterior temporal epileptiform discharges and a single right frontal sharp transient. No further seizures since March 2017, he is on Levetiracetam 1500mg  BID and Vimpat 200mg  BID with no side effects. Continue on current medications for now. He does not drive and is aware of [place] driving laws to stop driving after a seizure until 6 months seizure-free. He will follow-up in 6 months and knows to call our office for any problems  Thank you for allowing me to participate in his care.  Please do not hesitate to call for any questions or concerns.  The duration of this appointment visit was 15 minutes of face-to-face time with the patient.  Greater than 50% of this time was spent in counseling, explanation of diagnosis, planning of further management, and coordination of care.   Patrcia Dolly, M.D.   CC: Dr.  Reuel Boomaniel, Dr. Tiburcio PeaHarris

## 2017-01-17 MED FILL — VIMPAT 200 MG TABLET: 200 | 30 days supply | Qty: 60 | Fill #2

## 2017-02-06 MED FILL — DEXAMETHASONE 4 MG/ML VIAL: 4 | 1 days supply | Qty: 1 | Fill #0

## 2017-02-09 MED FILL — SYNTHROID 112 MCG TABLET: 112 | 30 days supply | Qty: 30 | Fill #2

## 2017-02-13 MED FILL — VIMPAT 200 MG TABLET: 200 | 30 days supply | Qty: 60 | Fill #3

## 2017-03-01 MED FILL — TESTOSTERONE CYP 200 MG/ML: 200 | 84 days supply | Qty: 6 | Fill #0

## 2017-03-10 MED FILL — SYNTHROID 112 MCG TABLET: 112 | 30 days supply | Qty: 30 | Fill #3

## 2017-03-16 MED FILL — HYDROCORTISONE 5 MG TABLET: 5 | 90 days supply | Qty: 180 | Fill #1

## 2017-03-18 IMAGING — MR MR HEAD WO/W CM
7 of 11 series · 28 of 48 positions shown · IV contrast (Yes   MULTIHANCE)
Comparison: Head CT 10/06/2014

CLINICAL DATA: Epilepsy. EEG abnormality in the posterior left
temporal region. History of congenital hydrocephalus status post VP
shunt.

EXAM:
MRI HEAD WITHOUT AND WITH CONTRAST
TECHNIQUE: Multiplanar, multiecho pulse sequences of the brain and surrounding
structures were obtained without and with intravenous contrast.
CONTRAST:  20mL MULTIHANCE GADOBENATE DIMEGLUMINE 529 MG/ML IV SOLN

[Series 4: DWI · axial · 3.0mm · 1.09mm/px · z∈[-109,+25]mm · 9 of 94 slices shown (1 of 2)]
[im 1/94]
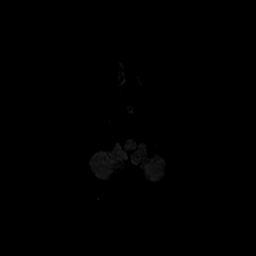
[im 12/94]
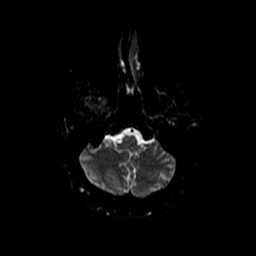
[im 24/94]
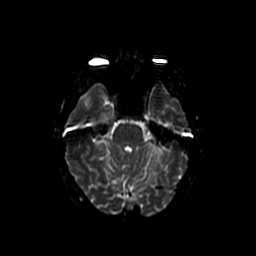
[im 35/94]
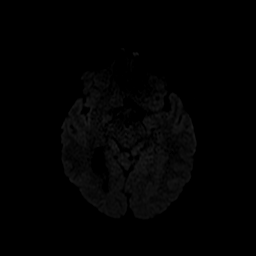
[im 47/94]
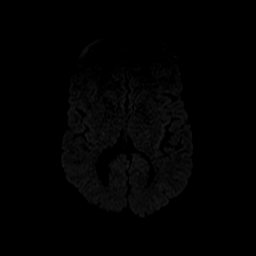
[im 59/94]
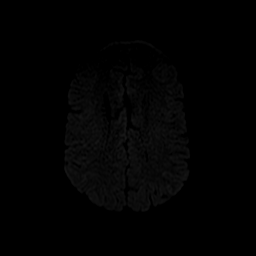
[im 70/94]
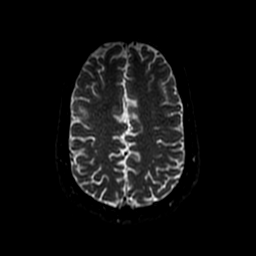
[im 82/94]
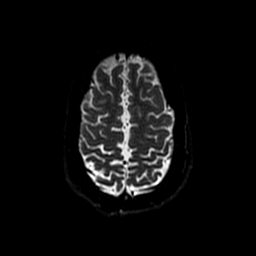
[im 94/94]
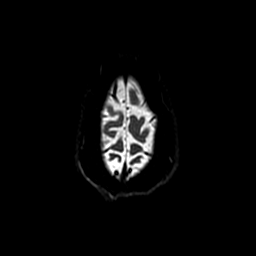

[Series 5: T2 · axial · 5.0mm · 0.47mm/px · z∈[-119,+37]mm · 3 of 28 slices shown (1 of 2)]
[im 1/28]
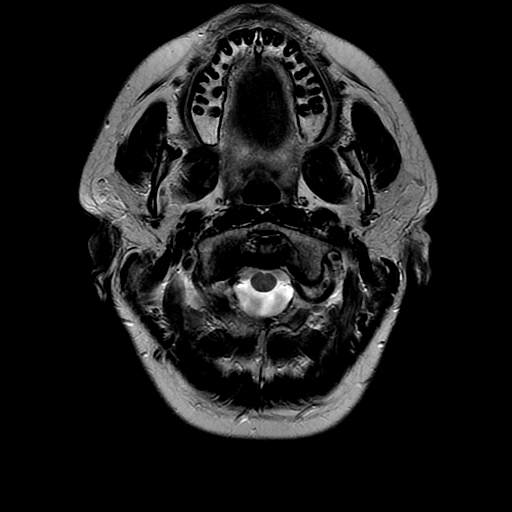
[im 14/28]
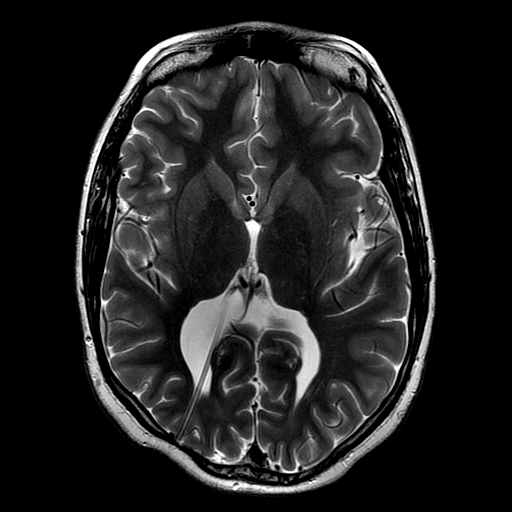
[im 28/28]
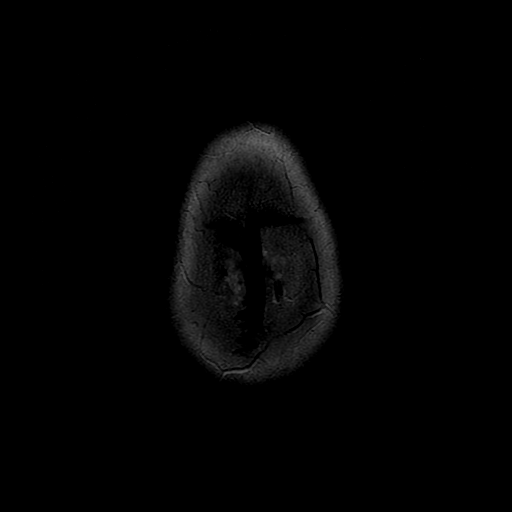

[Series 6: FLAIR · axial · 5.0mm · 0.47mm/px · z∈[-119,+37]mm · 3 of 28 slices shown]
[im 1/28]
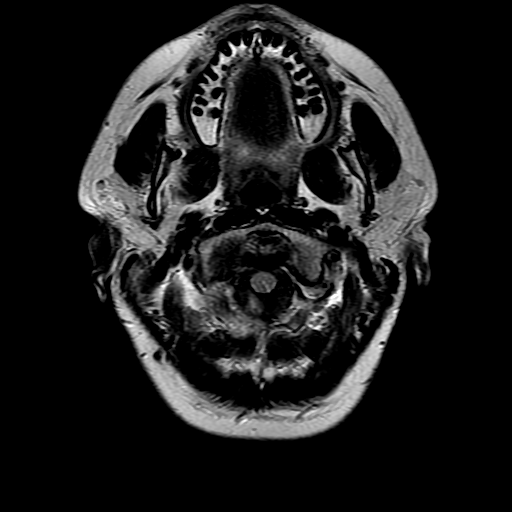
[im 14/28]
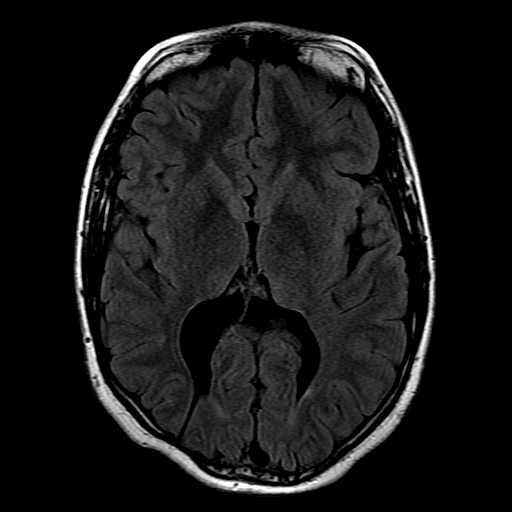
[im 28/28]
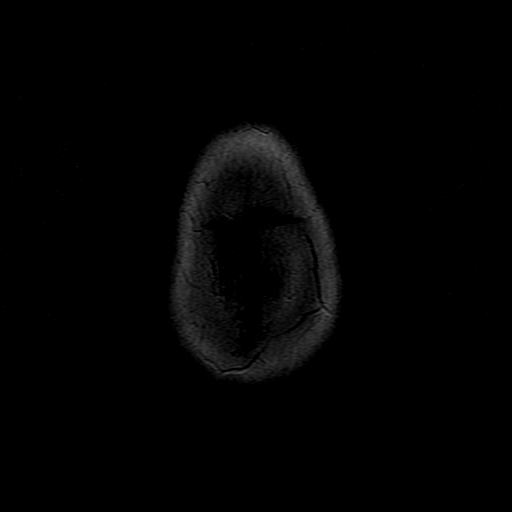

[Series 9: T2 · coronal · 3.0mm · 0.35mm/px · 3 of 30 slices shown (2 of 2)]
[im 1/30]
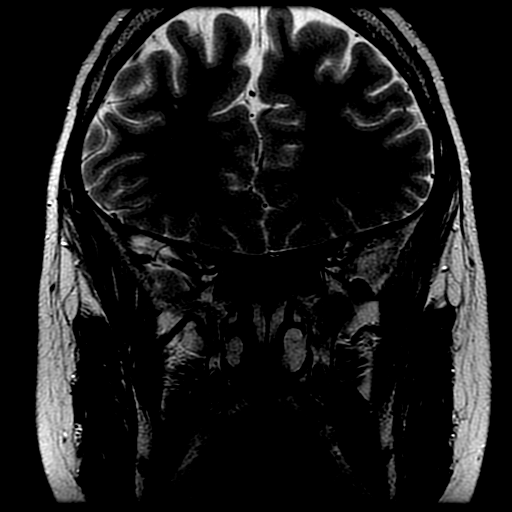
[im 15/30]
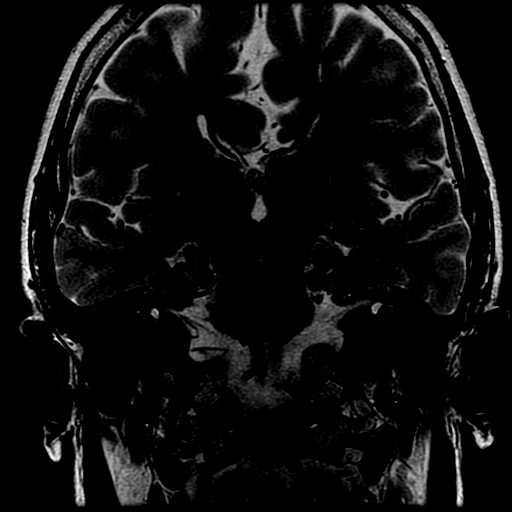
[im 30/30]
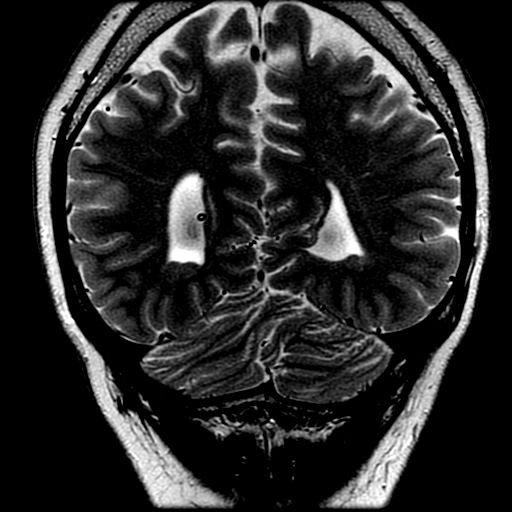

[Series 10: T2 post-contrast · coronal · 5.0mm · 0.45mm/px · 3 of 33 slices shown]
[im 1/33]
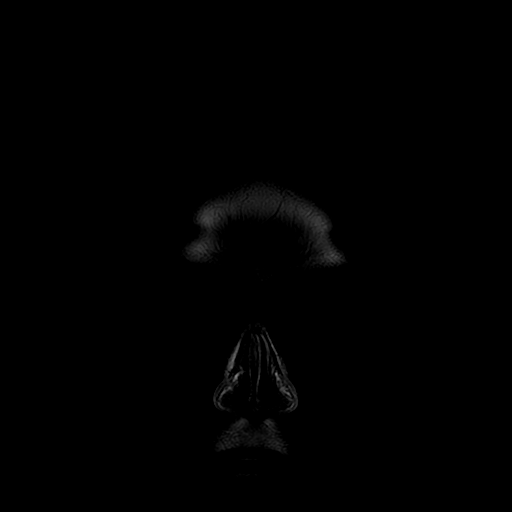
[im 17/33]
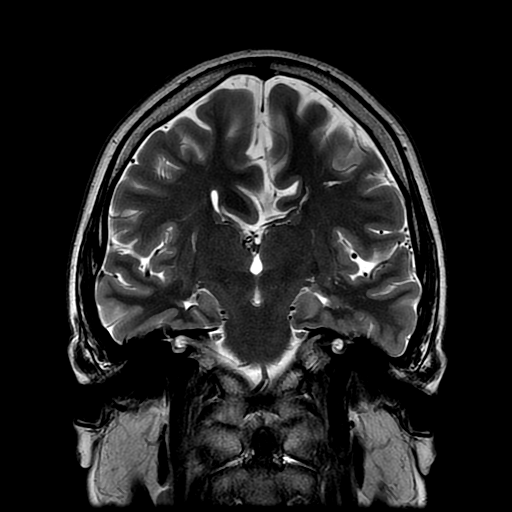
[im 33/33]
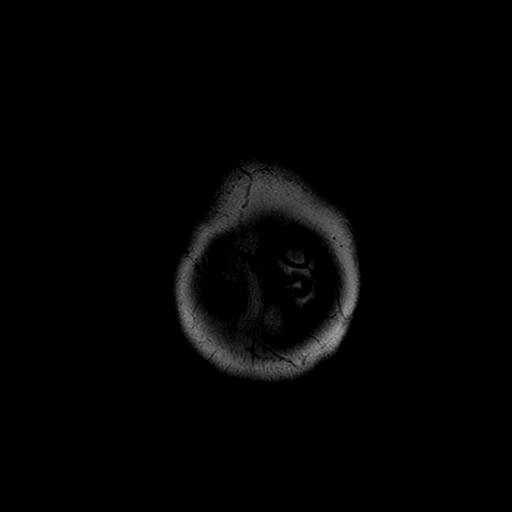

[Series 12: T1 post-contrast · coronal · 5.0mm · 0.45mm/px · 3 of 33 slices shown]
[im 1/33]
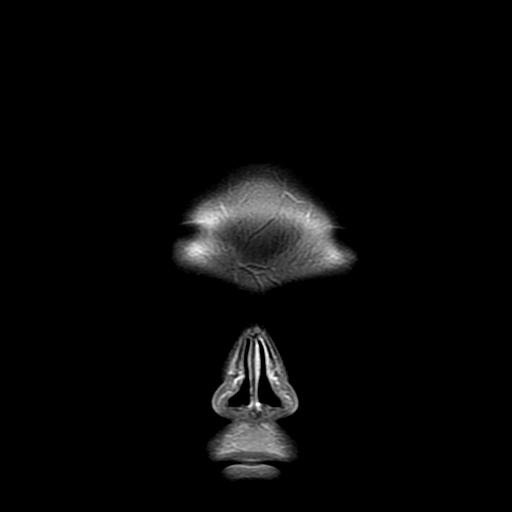
[im 17/33]
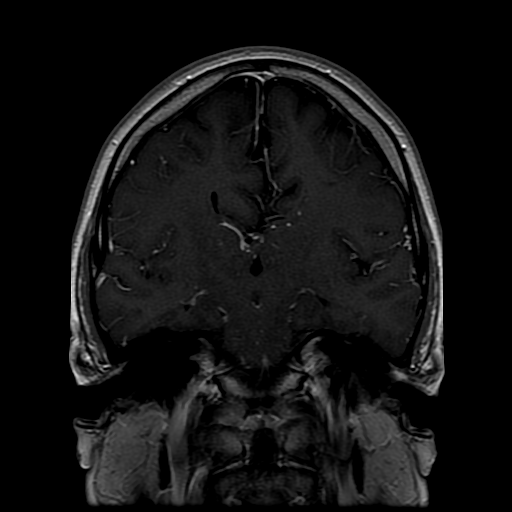
[im 33/33]
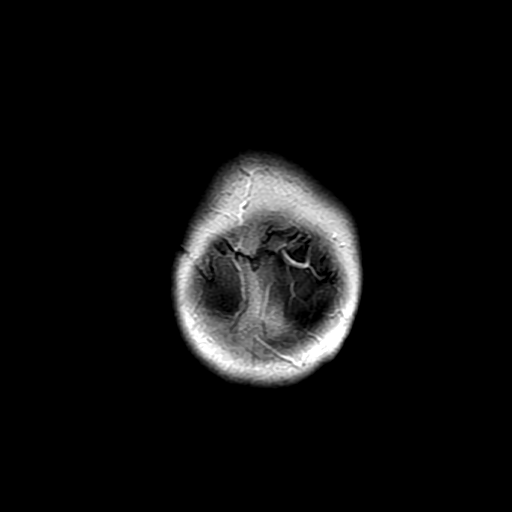

[Series 400: DWI · axial · 3.0mm · 1.09mm/px · z∈[-109,+25]mm · 4 of 47 slices shown (2 of 2)]
[im 1/47]
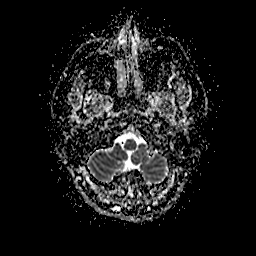
[im 16/47]
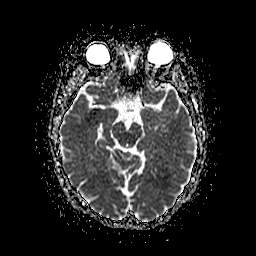
[im 31/47]
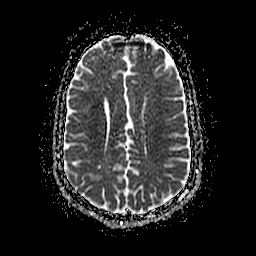
[im 47/47]
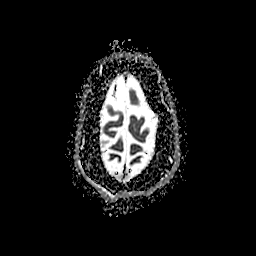

[28 of 48 positions shown; findings below may reference images not displayed]

FINDINGS: A right parietal VP shunt remains in place terminating in the
midline near the anterosuperior aspect of the third ventricle. The
ventricles are unchanged in size and configuration. There is partial
agenesis of the corpus callosum. The genu and a small portion of the
anterior body are present. The sella and pituitary gland are small.
The pituitary infundibulum is not well seen. The optic chiasm is
relatively normal in size.

There is mild cerebellar dysplasia. There is subependymal gray
matter heterotopia rather diffusely along the left greater than
right temporal horns and left occipital horn. There is no evidence
of acute infarct, intracranial hemorrhage, mass, midline shift, or
extra-axial fluid collection. No abnormal enhancement is identified.
There is a mildly prominent vein in the interhemispheric fissure
posteriorly draining into the right internal cerebral vein. A tiny
developmental venous anomaly is noted in the anterior left frontal
lobe.

Orbits are unremarkable. A small right maxillary sinus mucous
retention cyst is noted. Major intracranial vascular flow voids are
preserved.
IMPRESSION: 1. Rather extensive subependymal gray matter heterotopia along the
inferior aspects of the left greater than right lateral ventricles.
2. Partial genesis of the corpus callosum, small pituitary
gland/sella, and cerebellar dysplasia.
3. Unchanged size of the ventricles with VP shunt in place.
4. No acute abnormality identified.

## 2017-03-20 DIAGNOSIS — E039 Hypothyroidism, unspecified: Secondary | ICD-10-CM | POA: Diagnosis not present

## 2017-03-20 DIAGNOSIS — Z6826 Body mass index (BMI) 26.0-26.9, adult: Secondary | ICD-10-CM | POA: Diagnosis not present

## 2017-03-20 DIAGNOSIS — G40409 Other generalized epilepsy and epileptic syndromes, not intractable, without status epilepticus: Secondary | ICD-10-CM | POA: Diagnosis not present

## 2017-03-23 MED FILL — VIMPAT 200 MG TABLET: 200 | 30 days supply | Qty: 60 | Fill #4

## 2017-04-06 MED FILL — SYNTHROID 112 MCG TABLET: 112 | 30 days supply | Qty: 30 | Fill #4

## 2017-04-13 MED FILL — levETIRAcetam 750 MG TABS: 750 | 90 days supply | Qty: 360 | Fill #0

## 2017-04-18 MED FILL — VIMPAT 200 MG TABLET: 200 | 30 days supply | Qty: 60 | Fill #5

## 2017-05-09 MED FILL — SYNTHROID 112 MCG TABLET: 112 | 30 days supply | Qty: 30 | Fill #5

## 2017-05-22 MED FILL — VIMPAT 200 MG TABLET: 200 | 30 days supply | Qty: 60 | Fill #6

## 2017-05-29 MED FILL — HYDROCORTISONE 5 MG TABLET: 5 | 90 days supply | Qty: 270 | Fill #0

## 2017-06-05 MED FILL — SYNTHROID 112 MCG TABLET: 112 | 30 days supply | Qty: 30 | Fill #6

## 2017-06-19 MED FILL — VIMPAT 200 MG TABLET: 200 | 90 days supply | Qty: 180 | Fill #0

## 2017-07-04 MED FILL — SYNTHROID 112 MCG TABLET: 112 | 30 days supply | Qty: 30 | Fill #7

## 2017-07-10 MED FILL — levETIRAcetam 750 MG TABS: 750 | 90 days supply | Qty: 360 | Fill #1

## 2017-07-25 ENCOUNTER — Encounter: Payer: Self-pay | Admitting: Neurology

## 2017-07-25 ENCOUNTER — Ambulatory Visit: Payer: 59 | Admitting: Neurology

## 2017-07-25 ENCOUNTER — Ambulatory Visit: Payer: Self-pay | Admitting: Neurology

## 2017-07-25 VITALS — BP 108/74 | HR 91 | Ht 71.0 in | Wt 196.0 lb

## 2017-07-25 DIAGNOSIS — Q039 Congenital hydrocephalus, unspecified: Secondary | ICD-10-CM

## 2017-07-25 DIAGNOSIS — G40209 Localization-related (focal) (partial) symptomatic epilepsy and epileptic syndromes with complex partial seizures, not intractable, without status epilepticus: Secondary | ICD-10-CM

## 2017-07-25 MED ORDER — LEVETIRACETAM 750 MG PO TABS: 1500.0000 mg | ORAL_TABLET | Freq: Two times a day (BID) | ORAL | 3 refills | Status: DC

## 2017-07-25 MED ORDER — LACOSAMIDE 200 MG PO TABS: 200.0000 mg | ORAL_TABLET | Freq: Two times a day (BID) | ORAL | 3 refills | Status: DC

## 2017-07-25 NOTE — Patient Instructions (Signed)
Great seeing you! Continue all your medications, follow-up in 6 months. Call for any changes.  Seizure Precautions: 1. If medication has been prescribed for you to prevent seizures, take it exactly as directed.  Do not stop taking the medicine without talking to your doctor first, even if you have not had a seizure in a long time.   2. Avoid activities in which a seizure would cause danger to yourself or to others.  Don't operate dangerous machinery, swim alone, or climb in high or dangerous places, such as on ladders, roofs, or girders.  Do not drive unless your doctor says you may.  3. If you have any warning that you may have a seizure, lay down in a safe place where you can't hurt yourself.    4.  No driving for 6 months from last seizure, [place].   Please refer to the following link on the [employer]ile[employer]'s website for more information: http://www.epilepsyfoundation.org/answerplace/Social/driving/drivingu.cfm   5.  Maintain good sleep hygiene. Avoid alcohol.  6.  Contact your doctor if you have any problems that may be related to the medicine you are taking.  7.  Call 911 and bring the patient back to the ED if:        A.  The seizure lasts longer than 5 minutes.       B.  The patient doesn't awaken shortly after the seizure  C.  The patient has new problems such as difficulty seeing, speaking or moving  D.  The patient was injured during the seizure  E.  The patient has a temperature over 102 F (39C)  F.  The patient vomited and now is having trouble breathing

## 2017-07-25 NOTE — Progress Notes (Signed)
NEUROLOGY FOLLOW UP OFFICE NOTE  [name] [id number]  HISTORY OF PRESENT ILLNESS: I had the pleasure of seeing Clayton Torres in follow-up in the neurology clinic on 07/25/2017. The patient was last seen 6 months ago for recurrent seizures. He is again accompanied by his mother who helps supplement the history today. Since his last visit, he continues to do well with no seizures since 10/15/15. He is taking Levetiracetam 750mg  2 tabs BID and Vimpat 200mg  BID with no side effects.  He denies any staring/unresponsive episodes, gaps in time, olfactory/gustatory hallucinations, deja vu, rising epigastric sensation, focal numbness/tingling/weakness, myoclonic jerks. He denies any headaches, dizziness, diplopia, focal numbness/tingling/weakness, no falls.   HPI: This is a pleasant 26 yo LH man with a history of congenital hydrocephalus s/p VP shunt at age 817 months and shunt revision in 2001, hypopituitarism, and seizures since 2006. The first seizure occurred in August 2006 in the setting of the patient not eating and being in a very hot environment. He hit the left side of his head and had a witnessed GTC. He has no recollection/warning that he could remember. He had an EEG at that time and was told they could "see where the spike was," and was started on Dilantin. He was switched by his neurologist in [place] to Keppra a few months after. He went over a year without any seizures and medication taper was discussed. Within 2 weeks, he had a seizure in October 2007 out of sleep. Keppra was resumed and he did well for almost 7 years without any seizures until January 2014 when he had another GTC and Keppra dose was increased. He had several seizures that year, in March 2014, June 2014, and October 2014. He recalls feeling dizzy with the seizure in 05/2013, he fell in the tub. He was taking Keppra 1500mg  BID and Oxtellar was added. He had hyponatremia with a sodium of 119 and Oxtellar was stopped. He was also tried  on Aptiom which also caused hyponatremia. He was started on Vimpat in October 2014 and dose of 150mg  BID seemed to be the "magic combination" for him. His sodium levels stabilized between 135-138. Last Keppra level in March 2016 was 15. A Vimpat level in June 2015 was 29. He was again doing well with no seizures for almost 3 years until he had a witnessed convulsion on 10/15/15. He was at the computer writing a check, then his mother reported he did not answer her then turned red, followed by jerking and drooling for 5 minutes. No tongue bite or incontinence. After the seizure, he had sonorous respirations. He was also noted to break out in petechiae over his face. [facility] where bloodwork where CBC and CMP were normal, Keppra level was low at 4.8. He had a head CT without contrast which showed the right-sided transparietal VP catheter terminating just left of the midline, stable to prior study, no hydrocephalus. He had been seeing neurologist Dr. Lucia GaskinsAhern and Vimpat dose was increased to 200mg  BID. Most recent drug levels on 10/2015 showed a Keppra level of 13.7 (ref 10-40) and Vimpat level of 6.3 (ref 5-10).  They report that he got less sleep the night prior to the recent seizure, with only 4-1/2 hours of sleep. He denied any missed medication doses, no recent infections or alcohol intake. His parents deny any episodes of staring/unresponsiveness. He denies any gaps in time, olfactory/gustatory hallucinations, deja vu, rising epigastric sensation, focal numbness/tingling/weakness, myoclonic jerks. 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   Epilepsy Risk Factors:  Patient was born premature at 7 months with congenital hydrocephalus s/p VP shunt. He has a history of panhypopituitarism. Otherwise no history of febrile convulsions, CNS infections such as meningitis/encephalitis, significant traumatic brain injury, or family history of seizures.  Diagnostic Data: I personally  reviewed MRI brain with and without contrast which did not show any acute changes. 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 There is a tiny developmental venous anomaly is noted in the anterior left frontal lobe. His 1-hour sleep-deprived EEG was abnormal with occasional focal slowing over the left posterior temporal region, occasional epileptiform discharges over the left posterior temporal region, a single sharp transient was seen over the right frontal region.   Prior AEDs: Dilantin, generic Keppra (side effects and impaired seizure control per prior records)  PAST MEDICAL HISTORY: Past Medical History:  Diagnosis Date  . Hydrocephalus   . Seizure disorder [facility])     MEDICATIONS: Current Outpatient Medications on File Prior to Visit  Medication Sig Dispense Refill  . Aloe-Sodium Chloride (AYR SALINE NASAL GEL NA) Place 1 Dose into the nose daily.    Cathren Laine 4 MG/24HR PT24 patch Apply 4 mg topically daily.    . Ascorbic Acid (VITAMIN C) 1000 MG tablet Take 1,000 mg by mouth daily.    . Cholecalciferol (VITAMIN D-3 PO) Take 1,000 Units by mouth daily.    . ciclesonide (OMNARIS) 50 MCG/ACT nasal spray Place 2 sprays into both nostrils daily as needed for allergies.    . CORTEF 5 MG tablet Take 5 mg by mouth 2 (two) times daily. Will take 3 if increase stress    . Fexofenadine HCl (ALLEGRA PO) Take 180 mg by mouth daily. Every other day    . fexofenadine-pseudoephedrine (ALLEGRA-D 24) 180-240 MG 24 hr tablet Take 1 tablet by mouth. Every other day    . fluticasone (FLONASE) 50 MCG/ACT nasal spray Place into both nostrils daily.    Marland Kitchen lacosamide (VIMPAT) 200 MG TABS tablet Take 1  tablet (200 mg total) by mouth 2 (two) times daily. 180 tablet 3  . levETIRAcetam (KEPPRA) 750 MG tablet Take 2 tablets (1,500 mg total) by mouth 2 (two) times daily. 2 tabs 8am 2 tabs 8pm 360 tablet 3  . Multiple Vitamins tablet Take 1 tablet by mouth daily.    Marland Kitchen SYNTHROID 112 MCG tablet Take 112 mcg by mouth daily.    Marland Kitchen testosterone cypionate (DEPOTESTOTERONE CYPIONATE) 100 MG/ML injection Inject 200 mg into the muscle every 14 (fourteen) days. For IM use only     No current facility-administered medications on file prior to visit.     ALLERGIES: Allergies  Allergen Reactions  . Benzonatate Diarrhea  . Ceftriaxone Hives  . Eslicarbazepine Other (See Comments)    Low soduim  . Oxcarbazepine Other (See Comments)    Low soduim    FAMILY HISTORY: Family History  Problem Relation Age of Onset  . Hypertension Unknown   . Diabetes Unknown   . CAD Unknown   . COPD Unknown   . Cancer Paternal Grandmother   . Cancer Paternal Grandfather     SOCIAL HISTORY: Social History   Socioeconomic History  .  Marital status: Single    Spouse name: Not on file  . Number of children: Not on file  . Years of education: Not on file  . Highest education level: Not on file  Social Needs  . Financial resource strain: Not on file  . Food insecurity - worry: Not on file  . Food insecurity - inability: Not on file  . Transportation needs - medical: Not on file  . Transportation needs - non-medical: Not on file  Occupational History  . Occupation: Futures trader  Tobacco Use  . Smoking status: Never Smoker  . Smokeless tobacco: Never Used  Substance and Sexual Activity  . Alcohol use: No    Alcohol/week: 0.0 oz  . Drug use: No  . Sexual activity: Not on file  Other Topics Concern  . Not on file  Social History Narrative   Lives w/ parents   Caffeine use: 1 cup coffee per day      PA's: [phone]    REVIEW OF SYSTEMS: Constitutional: No fevers, chills, or sweats, no  generalized fatigue, change in appetite Eyes: No visual changes, double vision, eye pain Ear, nose and throat: No hearing loss, ear pain, nasal congestion, sore throat Cardiovascular: No chest pain, palpitations Respiratory:  No shortness of breath at rest or with exertion, wheezes GastrointestinaI: No nausea, vomiting, diarrhea, abdominal pain, fecal incontinence Genitourinary:  No dysuria, urinary retention or frequency Musculoskeletal:  No neck pain, back pain Integumentary: No rash, pruritus, skin lesions Neurological: as above Psychiatric: No depression, insomnia, anxiety Endocrine: No palpitations, fatigue, diaphoresis, mood swings, change in appetite, change in weight, increased thirst Hematologic/Lymphatic:  No anemia, purpura, petechiae. Allergic/Immunologic: no itchy/runny eyes, nasal congestion, recent allergic reactions, rashes  PHYSICAL EXAM: Vitals:   07/25/17 1511  BP: 108/74  Pulse: 91  SpO2: 98%   General: No acute distress Head:  Normocephalic/atraumatic Neck: supple, no paraspinal tenderness, full range of motion Heart:  Regular rate and rhythm Lungs:  Clear to auscultation bilaterally Back: No paraspinal tenderness Skin/Extremities: No rash, no edema Neurological Exam: alert and oriented to person, place, and time. No aphasia or dysarthria. Fund of knowledge is appropriate.  Recent and remote memory are intact. Attention and concentration are normal.    Able to name objects and repeat phrases. Cranial nerves: Pupils equal, round, reactive to light. Extraocular movements intact with no nystagmus. Visual fields full. Facial sensation intact. No facial asymmetry. Tongue, uvula, palate midline.  Motor: Bulk and tone normal, muscle strength 5/5 throughout with no pronator drift.  Sensation to light touch intact.  No extinction to double simultaneous stimulation.  Deep tendon reflexes 2+ throughout, toes downgoing.  Finger to nose testing intact.  Gait narrow-based and  steady, able to tandem walk adequately.  Romberg negative.  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 All his seizures have been convulsive seizures. 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 He continues to do well with no further seizures since March 2017, he is on Levetiracetam 1500mg  BID and Vimpat 200mg  BID with no side effects. Refills sent. He does not drive and is aware of [place] driving laws to stop driving after a seizure until 6 months seizure-free. He will follow-up in 6 months and knows to call our office for any problems  Thank you for  allowing me to participate in his care.  Please do not hesitate to call for any questions or concerns.  The duration of this appointment visit was 15 minutes of face-to-face time with the patient.  Greater than 50% of this time was spent in counseling, explanation of diagnosis, planning of further management, and coordination of care.   Patrcia DollyKaren April Carlyon, M.D.   CC: Dr. Reuel Boomaniel

## 2017-08-02 MED FILL — SYNTHROID 112 MCG TABLET: 112 | 30 days supply | Qty: 30 | Fill #8

## 2017-08-09 ENCOUNTER — Other Ambulatory Visit: Payer: Self-pay | Admitting: Neurology

## 2017-08-09 DIAGNOSIS — G40209 Localization-related (focal) (partial) symptomatic epilepsy and epileptic syndromes with complex partial seizures, not intractable, without status epilepticus: Secondary | ICD-10-CM

## 2017-08-14 MED FILL — TESTOSTERONE CYP 200 MG/ML: 200 | 84 days supply | Qty: 6 | Fill #0

## 2017-08-31 MED FILL — SYNTHROID 112 MCG TABLET: 112 | 30 days supply | Qty: 30 | Fill #0

## 2017-09-11 MED FILL — HYDROCORTISONE 5 MG TABLET: 5 | 90 days supply | Qty: 270 | Fill #1

## 2017-09-18 MED FILL — VIMPAT 200 MG TABLET: 200 | 90 days supply | Qty: 180 | Fill #0

## 2017-10-02 MED FILL — SYNTHROID 112 MCG TABLET: 112 | 30 days supply | Qty: 30 | Fill #1

## 2017-10-09 MED FILL — levETIRAcetam 750 MG TABS: 750 | 90 days supply | Qty: 360 | Fill #2

## 2017-10-30 MED FILL — SYNTHROID 112 MCG TABLET: 112 | 30 days supply | Qty: 30 | Fill #2

## 2017-11-23 MED FILL — SYNTHROID 112 MCG TABLET: 112 | 30 days supply | Qty: 30 | Fill #3

## 2017-12-11 DIAGNOSIS — G40409 Other generalized epilepsy and epileptic syndromes, not intractable, without status epilepticus: Secondary | ICD-10-CM | POA: Diagnosis not present

## 2017-12-11 DIAGNOSIS — E039 Hypothyroidism, unspecified: Secondary | ICD-10-CM | POA: Diagnosis not present

## 2017-12-11 DIAGNOSIS — Z6826 Body mass index (BMI) 26.0-26.9, adult: Secondary | ICD-10-CM | POA: Diagnosis not present

## 2017-12-11 DIAGNOSIS — Z0000 Encounter for general adult medical examination without abnormal findings: Secondary | ICD-10-CM | POA: Diagnosis not present

## 2017-12-15 MED FILL — VIMPAT 200 MG TABLET: 200 | 90 days supply | Qty: 180 | Fill #1

## 2017-12-26 MED FILL — HYDROCORTISONE 5 MG TABLET: 5 | 90 days supply | Qty: 270 | Fill #2

## 2017-12-26 MED FILL — levETIRAcetam 750 MG TABS: 750 | 90 days supply | Qty: 360 | Fill #0

## 2017-12-26 MED FILL — SYNTHROID 112 MCG TABLET: 112 | 30 days supply | Qty: 30 | Fill #4

## 2018-01-15 MED FILL — TESTOSTERONE CYP 200 MG/ML: 200 | 84 days supply | Qty: 6 | Fill #1

## 2018-01-23 ENCOUNTER — Ambulatory Visit: Payer: Self-pay | Admitting: Neurology

## 2018-01-23 ENCOUNTER — Other Ambulatory Visit: Payer: Self-pay

## 2018-01-23 ENCOUNTER — Encounter: Payer: Self-pay | Admitting: Neurology

## 2018-01-23 ENCOUNTER — Ambulatory Visit: Payer: 59 | Admitting: Neurology

## 2018-01-23 VITALS — BP 110/84 | HR 85 | Ht 70.5 in | Wt 189.0 lb

## 2018-01-23 DIAGNOSIS — G40209 Localization-related (focal) (partial) symptomatic epilepsy and epileptic syndromes with complex partial seizures, not intractable, without status epilepticus: Secondary | ICD-10-CM | POA: Diagnosis not present

## 2018-01-23 MED ORDER — LEVETIRACETAM 750 MG PO TABS: 1500.0000 mg | ORAL_TABLET | Freq: Two times a day (BID) | ORAL | 3 refills | Status: DC

## 2018-01-23 MED ORDER — LACOSAMIDE 200 MG PO TABS: 200.0000 mg | ORAL_TABLET | Freq: Two times a day (BID) | ORAL | 3 refills | Status: DC

## 2018-01-23 NOTE — Progress Notes (Signed)
NEUROLOGY FOLLOW UP OFFICE NOTE  Clayton Torres [id number]  DOB: 1991/02/19  HISTORY OF PRESENT ILLNESS: I had the pleasure of seeing Clayton Torres in follow-up in the neurology clinic on 01/23/2018. The patient was last seen 6 months ago for recurrent seizures. He is again accompanied by his father who helps supplement the history today. He continues to do well with no seizures since 10/15/15. He is taking Levetiracetam 750mg  2 tabs BID and Vimpat 200mg  BID with no side effects.  He denies any staring/unresponsive episodes, gaps in time, olfactory/gustatory hallucinations, deja vu, rising epigastric sensation, focal numbness/tingling/weakness, myoclonic jerks. He denies any headaches, dizziness, diplopia, focal numbness/tingling/weakness, no falls.   HPI: This is a pleasant 27 yo LH man with a history of congenital hydrocephalus s/p VP shunt at age 457 months and shunt revision in 2001, hypopituitarism, and seizures since 2006. The first seizure occurred in August 2006 in the setting of the patient not eating and being in a very hot environment. He hit the left side of his head and had a witnessed GTC. He has no recollection/warning that he could remember. He had an EEG at that time and was told they could "see where the spike was," and was started on Dilantin. He was switched by his neurologist in [place] to Keppra a few months after. He went over a year without any seizures and medication taper was discussed. Within 2 weeks, he had a seizure in October 2007 out of sleep. Keppra was resumed and he did well for almost 7 years without any seizures until January 2014 when he had another GTC and Keppra dose was increased. He had several seizures that year, in March 2014, June 2014, and October 2014. He recalls feeling dizzy with the seizure in 05/2013, he fell in the tub. He was taking Keppra 1500mg  BID and Oxtellar was added. He had hyponatremia with a sodium of 119 and Oxtellar was stopped. He was also tried on  Aptiom which also caused hyponatremia. He was started on Vimpat in October 2014 and dose of 150mg  BID seemed to be the "magic combination" for him. His sodium levels stabilized between 135-138. Last Keppra level in March 2016 was 15. A Vimpat level in June 2015 was 29. He was again doing well with no seizures for almost 3 years until he had a witnessed convulsion on 10/15/15. He was at the computer writing a check, then his mother reported he did not answer her then turned red, followed by jerking and drooling for 5 minutes. No tongue bite or incontinence. After the seizure, he had sonorous respirations. He was also noted to break out in petechiae over his face. [facility] where bloodwork where CBC and CMP were normal, Keppra level was low at 4.8. He had a head CT without contrast which showed the right-sided transparietal VP catheter terminating just left of the midline, stable to prior study, no hydrocephalus. He had been seeing neurologist Dr. Lucia GaskinsAhern and Vimpat dose was increased to 200mg  BID. Most recent drug levels on 10/2015 showed a Keppra level of 13.7 (ref 10-40) and Vimpat level of 6.3 (ref 5-10).  They report that he got less sleep the night prior to the recent seizure, with only 4-1/2 hours of sleep. He denied any missed medication doses, no recent infections or alcohol intake. His parents deny any episodes of staring/unresponsiveness. He denies any gaps in time, olfactory/gustatory hallucinations, deja vu, rising epigastric sensation, focal numbness/tingling/weakness, myoclonic jerks. 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   Epilepsy Risk Factors:  Patient was born premature at 7 months with congenital hydrocephalus s/p VP shunt. He has a history of panhypopituitarism. Otherwise no history of febrile convulsions, CNS infections such as meningitis/encephalitis, significant traumatic brain injury, or family history of seizures.  Diagnostic Data: I personally reviewed  MRI brain with and without contrast which did not show any acute changes. 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 There is a tiny developmental venous anomaly is noted in the anterior left frontal lobe. His 1-hour sleep-deprived EEG was abnormal with occasional focal slowing over the left posterior temporal region, occasional epileptiform discharges over the left posterior temporal region, a single sharp transient was seen over the right frontal region.   Prior AEDs: Dilantin, generic Keppra (side effects and impaired seizure control per prior records)  PAST MEDICAL HISTORY: Past Medical History:  Diagnosis Date  . Hydrocephalus   . Seizure disorder [facility])     MEDICATIONS: Current Outpatient Medications on File Prior to Visit  Medication Sig Dispense Refill  . Aloe-Sodium Chloride (AYR SALINE NASAL GEL NA) Place 1 Dose into the nose daily.    Cathren Laine 4 MG/24HR PT24 patch Apply 4 mg topically daily.    . Ascorbic Acid (VITAMIN C) 1000 MG tablet Take 1,000 mg by mouth daily.    . Cholecalciferol (VITAMIN D-3 PO) Take 1,000 Units by mouth daily.    . ciclesonide (OMNARIS) 50 MCG/ACT nasal spray Place 2 sprays into both nostrils daily as needed for allergies.    . CORTEF 5 MG tablet Take 5 mg by mouth 2 (two) times daily. Will take 3 if increase stress    . Fexofenadine HCl (ALLEGRA PO) Take 180 mg by mouth daily. Every other day    . fexofenadine-pseudoephedrine (ALLEGRA-D 24) 180-240 MG 24 hr tablet Take 1 tablet by mouth. Every other day    . fluticasone (FLONASE) 50 MCG/ACT nasal spray Place into both nostrils daily.    Marland Kitchen levETIRAcetam (KEPPRA) 750 MG tablet Take 2 tablets  (1,500 mg total) by mouth 2 (two) times daily. 2 tabs 8am 2 tabs 8pm 360 tablet 3  . Multiple Vitamins tablet Take 1 tablet by mouth daily.    Marland Kitchen SYNTHROID 112 MCG tablet Take 112 mcg by mouth daily.    Marland Kitchen testosterone cypionate (DEPOTESTOTERONE CYPIONATE) 100 MG/ML injection Inject 200 mg into the muscle every 14 (fourteen) days. For IM use only    . VIMPAT 200 MG TABS tablet TAKE 1 TABLET BY MOUTH 2 TIMES DAILY. 180 tablet 3   No current facility-administered medications on file prior to visit.     ALLERGIES: Allergies  Allergen Reactions  . Benzonatate Diarrhea  . Ceftriaxone Hives  . Eslicarbazepine Other (See Comments)    Low soduim  . Oxcarbazepine Other (See Comments)    Low soduim    FAMILY HISTORY: Family History  Problem Relation Age of Onset  . Hypertension Unknown   . Diabetes Unknown   . CAD Unknown   . COPD Unknown   . Cancer Paternal Grandmother   . Cancer Paternal Grandfather     SOCIAL HISTORY: Social History   Socioeconomic History  . Marital status: Single  Spouse name: Not on file  . Number of children: Not on file  . Years of education: Not on file  . Highest education level: Not on file  Occupational History  . Occupation: Futures trader  Social Needs  . Financial resource strain: Not on file  . Food insecurity:    Worry: Not on file    Inability: Not on file  . Transportation needs:    Medical: Not on file    Non-medical: Not on file  Tobacco Use  . Smoking status: Never Smoker  . Smokeless tobacco: Never Used  Substance and Sexual Activity  . Alcohol use: No    Alcohol/week: 0.0 oz  . Drug use: No  . Sexual activity: Not on file  Lifestyle  . Physical activity:    Days per week: Not on file    Minutes per session: Not on file  . Stress: Not on file  Relationships  . Social connections:    Talks on phone: Not on file    Gets together: Not on file    Attends religious service: Not on file    Active member of club or  organization: Not on file    Attends meetings of clubs or organizations: Not on file    Relationship status: Not on file  . Intimate partner violence:    Fear of current or ex partner: Not on file    Emotionally abused: Not on file    Physically abused: Not on file    Forced sexual activity: Not on file  Other Topics Concern  . Not on file  Social History Narrative   Lives w/ parents   Caffeine use: 1 cup coffee per day      PA's: [phone]    REVIEW OF SYSTEMS: Constitutional: No fevers, chills, or sweats, no generalized fatigue, change in appetite Eyes: No visual changes, double vision, eye pain Ear, nose and throat: No hearing loss, ear pain, nasal congestion, sore throat Cardiovascular: No chest pain, palpitations Respiratory:  No shortness of breath at rest or with exertion, wheezes GastrointestinaI: No nausea, vomiting, diarrhea, abdominal pain, fecal incontinence Genitourinary:  No dysuria, urinary retention or frequency Musculoskeletal:  No neck pain, back pain Integumentary: No rash, pruritus, skin lesions Neurological: as above Psychiatric: No depression, insomnia, anxiety Endocrine: No palpitations, fatigue, diaphoresis, mood swings, change in appetite, change in weight, increased thirst Hematologic/Lymphatic:  No anemia, purpura, petechiae. Allergic/Immunologic: no itchy/runny eyes, nasal congestion, recent allergic reactions, rashes  PHYSICAL EXAM: Vitals:   01/23/18 1434  BP: 110/84  Pulse: 85  SpO2: 96%   General: No acute distress Head:  Normocephalic/atraumatic Neck: supple, no paraspinal tenderness, full range of motion Heart:  Regular rate and rhythm Lungs:  Clear to auscultation bilaterally Back: No paraspinal tenderness Skin/Extremities: No rash, no edema Neurological Exam: alert and oriented to person, place, and time. No aphasia or dysarthria. Fund of knowledge is appropriate.  Recent and remote memory are intact. 3/3 delayed recall.  Attention and concentration are normal.    Able to name objects and repeat phrases. Cranial nerves: Pupils equal, round, reactive to light. Extraocular movements intact with no nystagmus. Visual fields full. Facial sensation intact. No facial asymmetry. Tongue, uvula, palate midline.  Motor: Bulk and tone normal, muscle strength 5/5 throughout with no pronator drift.  Sensation to light touch intact.  No extinction to double simultaneous stimulation.  Deep tendon reflexes 2+ throughout, toes downgoing.  Finger to nose testing intact.  Gait narrow-based and steady, able to  tandem walk adequately.  Romberg negative.  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 All his seizures have been convulsive seizures. 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 No seizures or seizure-like symptoms since March 2017. Refills for Levetiracetam 1500mg  BID and Vimpat 200mg  BID were sent today. He does not drive and is aware of [place] driving laws to stop driving after a seizure until 6 months seizure-free. He will follow-up in 8 months and knows to call our office for any problems  Thank you for allowing me to participate in his care.  Please do not hesitate to call for any questions or concerns.  The duration of this appointment visit was 15 minutes of face-to-face time with the patient.  Greater than 50% of this time was spent in counseling, explanation of diagnosis, planning of further management, and coordination of care.   Patrcia Dolly, M.D.   CC: Dr. Reuel Boom

## 2018-01-23 NOTE — Patient Instructions (Signed)
Looking great! Continue all your medications. Follow-up in 8 months, call for any changes.  Seizure Precautions: 1. If medication has been prescribed for you to prevent seizures, take it exactly as directed.  Do not stop taking the medicine without talking to your doctor first, even if you have not had a seizure in a long time.   2. Avoid activities in which a seizure would cause danger to yourself or to others.  Don't operate dangerous machinery, swim alone, or climb in high or dangerous places, such as on ladders, roofs, or girders.  Do not drive unless your doctor says you may.  3. If you have any warning that you may have a seizure, lay down in a safe place where you can't hurt yourself.    4.  No driving for 6 months from last seizure, as per [facility] [place].   Please refer to the following link on the Epilepsy Foundation of America's website for more information: http://www.epilepsyfoundation.org/answerplace/Social/driving/drivingu.cfm   5.  Maintain good sleep hygiene. Avoid alcohol.  6.  Contact your doctor if you have any problems that may be related to the medicine you are taking.  7.  Call 911 and bring the patient back to the ED if:        A.  The seizure lasts longer than 5 minutes.       B.  The patient doesn't awaken shortly after the seizure  C.  The patient has new problems such as difficulty seeing, speaking or moving  D.  The patient was injured during the seizure  E.  The patient has a temperature over 102 F (39C)  F.  The patient vomited and now is having trouble breathing

## 2018-01-29 MED FILL — SYNTHROID 112 MCG TABLET: 112 | 30 days supply | Qty: 30 | Fill #5

## 2018-02-01 DIAGNOSIS — H5213 Myopia, bilateral: Secondary | ICD-10-CM | POA: Diagnosis not present

## 2018-02-01 DIAGNOSIS — Q038 Other congenital hydrocephalus: Secondary | ICD-10-CM | POA: Diagnosis not present

## 2018-02-26 MED FILL — SYNTHROID 112 MCG TABLET: 112 | 30 days supply | Qty: 30 | Fill #6

## 2018-03-13 MED FILL — VIMPAT 200 MG TABLET: 200 | 90 days supply | Qty: 180 | Fill #0

## 2018-03-21 MED FILL — ANDRODERM 4 MG/24HR PT24: 4 | 30 days supply | Qty: 30 | Fill #0

## 2018-03-28 MED FILL — SYNTHROID 112 MCG TABLET: 112 | 30 days supply | Qty: 30 | Fill #7

## 2018-03-28 MED FILL — HYDROCORTISONE 5 MG TABLET: 5 | 90 days supply | Qty: 270 | Fill #3

## 2018-03-29 DIAGNOSIS — G40409 Other generalized epilepsy and epileptic syndromes, not intractable, without status epilepticus: Secondary | ICD-10-CM | POA: Diagnosis not present

## 2018-03-29 DIAGNOSIS — Z1331 Encounter for screening for depression: Secondary | ICD-10-CM | POA: Diagnosis not present

## 2018-03-29 DIAGNOSIS — Z6825 Body mass index (BMI) 25.0-25.9, adult: Secondary | ICD-10-CM | POA: Diagnosis not present

## 2018-03-29 DIAGNOSIS — E230 Hypopituitarism: Secondary | ICD-10-CM | POA: Diagnosis not present

## 2018-03-29 DIAGNOSIS — E039 Hypothyroidism, unspecified: Secondary | ICD-10-CM | POA: Diagnosis not present

## 2018-03-29 DIAGNOSIS — Z1389 Encounter for screening for other disorder: Secondary | ICD-10-CM | POA: Diagnosis not present

## 2018-03-29 DIAGNOSIS — J301 Allergic rhinitis due to pollen: Secondary | ICD-10-CM | POA: Diagnosis not present

## 2018-04-10 MED FILL — levETIRAcetam 750 MG TABS: 750 | 90 days supply | Qty: 360 | Fill #0

## 2018-04-25 MED FILL — ANDRODERM 4 MG/24HR PT24: 4 | 30 days supply | Qty: 30 | Fill #1

## 2018-04-25 MED FILL — SYNTHROID 112 MCG TABLET: 112 | 30 days supply | Qty: 30 | Fill #8

## 2018-05-28 MED FILL — SYNTHROID 112 MCG TABLET: 112 | 30 days supply | Qty: 30 | Fill #9

## 2018-05-28 MED FILL — ANDRODERM 4 MG/24HR PT24: 4 | 30 days supply | Qty: 30 | Fill #2

## 2018-06-11 MED FILL — VIMPAT 200 MG TABLET: 200 | 90 days supply | Qty: 180 | Fill #1

## 2018-06-27 MED FILL — ANDRODERM 4 MG/24HR PT24: 4 | 30 days supply | Qty: 30 | Fill #3

## 2018-06-27 MED FILL — SYNTHROID 112 MCG TABLET: 112 | 30 days supply | Qty: 30 | Fill #10

## 2018-07-09 MED FILL — levETIRAcetam 750 MG TABS: 750 | 90 days supply | Qty: 360 | Fill #1

## 2018-07-26 MED FILL — SYNTHROID 112 MCG TABLET: 112 | 30 days supply | Qty: 30 | Fill #11

## 2018-07-26 MED FILL — ANDRODERM 4 MG/24HR PT24: 4 | 30 days supply | Qty: 30 | Fill #4

## 2018-07-26 MED FILL — HYDROCORTISONE 5 MG TABLET: 5 | 90 days supply | Qty: 270 | Fill #0

## 2018-08-22 MED FILL — SYNTHROID 112 MCG TABLET: 112 | 30 days supply | Qty: 30 | Fill #0

## 2018-08-23 MED FILL — ANDRODERM 4 MG/24HR PT24: 4 | 30 days supply | Qty: 30 | Fill #5

## 2018-09-10 ENCOUNTER — Other Ambulatory Visit: Payer: Self-pay | Admitting: Neurology

## 2018-09-10 DIAGNOSIS — G40209 Localization-related (focal) (partial) symptomatic epilepsy and epileptic syndromes with complex partial seizures, not intractable, without status epilepticus: Secondary | ICD-10-CM

## 2018-09-10 MED FILL — VIMPAT 200 MG TABLET: 200 | 90 days supply | Qty: 180 | Fill #0 | Status: TO

## 2018-09-10 NOTE — Telephone Encounter (Signed)
Forwarded to Dr. Aquino for approval 

## 2018-09-24 MED FILL — SYNTHROID 112 MCG TABLET: 112 | 30 days supply | Qty: 30 | Fill #1

## 2018-09-25 ENCOUNTER — Encounter: Payer: Self-pay | Admitting: Neurology

## 2018-09-25 ENCOUNTER — Other Ambulatory Visit: Payer: Self-pay

## 2018-09-25 ENCOUNTER — Ambulatory Visit: Payer: 59 | Admitting: Neurology

## 2018-09-25 VITALS — BP 98/70 | HR 80 | Ht 70.5 in | Wt 191.0 lb

## 2018-09-25 DIAGNOSIS — G40209 Localization-related (focal) (partial) symptomatic epilepsy and epileptic syndromes with complex partial seizures, not intractable, without status epilepticus: Secondary | ICD-10-CM | POA: Diagnosis not present

## 2018-09-25 MED ORDER — LACOSAMIDE 200 MG PO TABS: 200.0000 mg | ORAL_TABLET | Freq: Two times a day (BID) | ORAL | 3 refills | Status: DC

## 2018-09-25 MED ORDER — LEVETIRACETAM 750 MG PO TABS: 1500.0000 mg | ORAL_TABLET | Freq: Two times a day (BID) | ORAL | 3 refills | Status: DC

## 2018-09-25 MED FILL — levETIRAcetam 750 MG TABS: 750 | 90 days supply | Qty: 360 | Fill #0 | Status: TO

## 2018-09-25 NOTE — Progress Notes (Signed)
NEUROLOGY FOLLOW UP OFFICE NOTE  Clayton Torres [id number]  DOB: 22-Sep-1990  HISTORY OF PRESENT ILLNESS: I had the pleasure of seeing Clayton Torres in follow-up in the neurology clinic on 09/25/2018. The patient was last seen 8 months ago for recurrent seizures. He is again accompanied by both parents who help supplement the history today. He has been doing well with no seizures since March 2017. He continues on  Levetiracetam 750mg  2 tabs BID and Vimpat 200mg  BID with no side effects. Family denies any staring/unresponsive episodes, he denies any gaps in time, olfactory/gustatory hallucinations, deja vu, rising epigastric sensation, focal numbness/tingling/weakness, myoclonic jerks. He denies any headaches, dizziness, diplopia, focal numbness/tingling/weakness, no falls. Sleep is good. Mood is good. He has a follow-up with his neurosurgeon for the VP shunt next month.  HPI: This is a pleasant 28 yo LH man with a history of congenital hydrocephalus s/p VP shunt at age 697 months and shunt revision in 2001, hypopituitarism, and seizures since 2006. The first seizure occurred in August 2006 in the setting of the patient not eating and being in a very hot environment. He hit the left side of his head and had a witnessed GTC. He has no recollection/warning that he could remember. He had an EEG at that time and was told they could "see where the spike was," and was started on Dilantin. He was switched by his neurologist in [place] to Keppra a few months after. He went over a year without any seizures and medication taper was discussed. Within 2 weeks, he had a seizure in October 2007 out of sleep. Keppra was resumed and he did well for almost 7 years without any seizures until January 2014 when he had another GTC and Keppra dose was increased. He had several seizures that year, in March 2014, June 2014, and October 2014. He recalls feeling dizzy with the seizure in 05/2013, he fell in the tub. He was taking Keppra  1500mg  BID and Oxtellar was added. He had hyponatremia with a sodium of 119 and Oxtellar was stopped. He was also tried on Aptiom which also caused hyponatremia. He was started on Vimpat in October 2014 and dose of 150mg  BID seemed to be the "magic combination" for him. His sodium levels stabilized between 135-138. Last Keppra level in March 2016 was 15. A Vimpat level in June 2015 was 29. He was again doing well with no seizures for almost 3 years until he had a witnessed convulsion on 10/15/15. He was at the computer writing a check, then his mother reported he did not answer her then turned red, followed by jerking and drooling for 5 minutes. No tongue bite or incontinence. After the seizure, he had sonorous respirations. He was also noted to break out in petechiae over his face. [facility] where bloodwork where CBC and CMP were normal, Keppra level was low at 4.8. He had a head CT without contrast which showed the right-sided transparietal VP catheter terminating just left of the midline, stable to prior study, no hydrocephalus. He had been seeing neurologist Dr. Lucia GaskinsAhern and Vimpat dose was increased to 200mg  BID. Most recent drug levels on 10/2015 showed a Keppra level of 13.7 (ref 10-40) and Vimpat level of 6.3 (ref 5-10).  They report that he got less sleep the night prior to the recent seizure, with only 4-1/2 hours of sleep. He denied any missed medication doses, no recent infections or alcohol intake. His parents deny any episodes of  staring/unresponsiveness. He denies any gaps in time, olfactory/gustatory hallucinations, deja vu, rising epigastric sensation, focal numbness/tingling/weakness, myoclonic jerks. He works at a [employer], he does not drive.   Epilepsy Risk Factors:  Patient was born premature at 7 months with congenital hydrocephalus s/p VP shunt. He has a history of panhypopituitarism. Otherwise no history of febrile convulsions, CNS infections such as  meningitis/encephalitis, significant traumatic brain injury, or family history of seizures.  Diagnostic Data: I personally reviewed MRI brain with and without contrast which did not show any acute changes. 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 There is a tiny developmental venous anomaly is noted in the anterior left frontal lobe. His 1-hour sleep-deprived EEG was abnormal with occasional focal slowing over the left posterior temporal region, occasional epileptiform discharges over the left posterior temporal region, a single sharp transient was seen over the right frontal region.   Prior AEDs: Dilantin, generic Keppra (side effects and impaired seizure control per prior records)  PAST MEDICAL HISTORY: Past Medical History:  Diagnosis Date  . Hydrocephalus (HCC)   . Seizure disorder [facility])     MEDICATIONS: Current Outpatient Medications on File Prior to Visit  Medication Sig Dispense Refill  . Aloe-Sodium Chloride (AYR SALINE NASAL GEL NA) Place 1 Dose into the nose daily.    Cathren Laine 4 MG/24HR PT24 patch Apply 4 mg topically daily.    . Ascorbic Acid (VITAMIN C) 1000 MG tablet Take 1,000 mg by mouth daily.    . Cholecalciferol (VITAMIN D-3 PO) Take 1,000 Units by mouth daily.    . ciclesonide (OMNARIS) 50 MCG/ACT nasal spray Place 2 sprays into both nostrils daily as needed for allergies.    . CORTEF 5 MG tablet Take 5 mg by mouth 2 (two) times daily. Will take 3 if increase stress    . Fexofenadine HCl (ALLEGRA PO) Take 180 mg by mouth daily. Every other day    . fexofenadine-pseudoephedrine (ALLEGRA-D 24) 180-240 MG 24 hr tablet Take 1 tablet by mouth. Every other day      . fluticasone (FLONASE) 50 MCG/ACT nasal spray Place into both nostrils daily.    Marland Kitchen levETIRAcetam (KEPPRA) 750 MG tablet Take 2 tablets (1,500 mg total) by mouth 2 (two) times daily. 2 tabs 8am 2 tabs 8pm 360 tablet 3  . Multiple Vitamins tablet Take 1 tablet by mouth daily.    Marland Kitchen SYNTHROID 112 MCG tablet Take 112 mcg by mouth daily.    Marland Kitchen VIMPAT 200 MG TABS tablet TAKE 1 TABLET BY MOUTH 2 TIMES DAILY. 180 tablet 3   No current facility-administered medications on file prior to visit.     ALLERGIES: Allergies  Allergen Reactions  . Benzonatate Diarrhea  . Ceftriaxone Hives  . Eslicarbazepine Other (See Comments)    Low soduim  . Oxcarbazepine Other (See Comments)    Low soduim    FAMILY HISTORY: Family History  Problem Relation Age of Onset  . Hypertension Unknown   . Diabetes Unknown   . CAD Unknown   . COPD Unknown   . Cancer Paternal Grandmother   . Cancer Paternal Grandfather     SOCIAL HISTORY: Social History   Socioeconomic History  . Marital status: Single  Spouse name: Not on file  . Number of children: Not on file  . Years of education: Not on file  . Highest education level: Not on file  Occupational History  . Occupation: Futures trader  Social Needs  . Financial resource strain: Not on file  . Food insecurity:    Worry: Not on file    Inability: Not on file  . Transportation needs:    Medical: Not on file    Non-medical: Not on file  Tobacco Use  . Smoking status: Never Smoker  . Smokeless tobacco: Never Used  Substance and Sexual Activity  . Alcohol use: No    Alcohol/week: 0.0 standard drinks  . Drug use: No  . Sexual activity: Not on file  Lifestyle  . Physical activity:    Days per week: Not on file    Minutes per session: Not on file  . Stress: Not on file  Relationships  . Social connections:    Talks on phone: Not on file    Gets together: Not on file    Attends religious service: Not on file    Active member of club or  organization: Not on file    Attends meetings of clubs or organizations: Not on file    Relationship status: Not on file  . Intimate partner violence:    Fear of current or ex partner: Not on file    Emotionally abused: Not on file    Physically abused: Not on file    Forced sexual activity: Not on file  Other Topics Concern  . Not on file  Social History Narrative   Lives w/ parents   Caffeine use: 1 cup coffee per day      PA's: [phone]    REVIEW OF SYSTEMS: Constitutional: No fevers, chills, or sweats, no generalized fatigue, change in appetite Eyes: No visual changes, double vision, eye pain Ear, nose and throat: No hearing loss, ear pain, nasal congestion, sore throat Cardiovascular: No chest pain, palpitations Respiratory:  No shortness of breath at rest or with exertion, wheezes GastrointestinaI: No nausea, vomiting, diarrhea, abdominal pain, fecal incontinence Genitourinary:  No dysuria, urinary retention or frequency Musculoskeletal:  No neck pain, back pain Integumentary: No rash, pruritus, skin lesions Neurological: as above Psychiatric: No depression, insomnia, anxiety Endocrine: No palpitations, fatigue, diaphoresis, mood swings, change in appetite, change in weight, increased thirst Hematologic/Lymphatic:  No anemia, purpura, petechiae. Allergic/Immunologic: no itchy/runny eyes, nasal congestion, recent allergic reactions, rashes  PHYSICAL EXAM: Vitals:   09/25/18 1502  BP: 98/70  Pulse: 80  SpO2: 97%   General: No acute distress Head:  Normocephalic/atraumatic Neck: supple, no paraspinal tenderness, full range of motion Heart:  Regular rate and rhythm Lungs:  Clear to auscultation bilaterally Back: No paraspinal tenderness Skin/Extremities: No rash, no edema Neurological Exam: alert and oriented to person, place, and time. No aphasia or dysarthria. Fund of knowledge is appropriate.  Recent and remote memory are intact. 3/3 delayed recall. Attention  and concentration are normal, able to spell WORLD backward.  Able to name objects and repeat phrases. Cranial nerves: Pupils equal, round, reactive to light. Extraocular movements intact with no nystagmus. Visual fields full. Facial sensation intact. No facial asymmetry. Tongue, uvula, palate midline.  Motor: Bulk and tone normal, muscle strength 5/5 throughout with no pronator drift.  Sensation to light touch intact.  No extinction to double simultaneous stimulation. Finger to nose testing intact.  Gait narrow-based and steady, able to tandem walk adequately.  Romberg  negative.  IMPRESSION: This is a pleasant 28 yo LH man with a history of congenital hydrocephalus s/p VP shunt, panhypopituitarism, and focal to bilateral tonic-clonic epilepsy since 2006. All his seizures have been convulsive seizures. 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 He continues to do well with no seizures since March 2017. Continue Levetiracetam 1500mg  BID and Vimpat 200mg  BID, refills sent. He does not drive and is aware of [place] driving laws to stop driving after a seizure until 6 months seizure-free. He will follow-up in 1 year and knows to call our office for any problems  Thank you for allowing me to participate in his care.  Please do not hesitate to call for any questions or concerns.  The duration of this appointment visit was 15 minutes of face-to-face time with the patient.  Greater than 50% of this time was spent in counseling, explanation of diagnosis, planning of further management, and coordination of care.   Patrcia Dolly, M.D.   CC: Dr. Reuel Boom

## 2018-09-25 NOTE — Patient Instructions (Signed)
Great seeing you! Continue all your medications. Follow-up in 1 year, call for any changes.  Seizure Precautions: 1. If medication has been prescribed for you to prevent seizures, take it exactly as directed.  Do not stop taking the medicine without talking to your doctor first, even if you have not had a seizure in a long time.   2. Avoid activities in which a seizure would cause danger to yourself or to others.  Don't operate dangerous machinery, swim alone, or climb in high or dangerous places, such as on ladders, roofs, or girders.  Do not drive unless your doctor says you may.  3. If you have any warning that you may have a seizure, lay down in a safe place where you can't hurt yourself.    4.  No driving for 6 months from last seizure, as per [place] state law.   Please refer to the following link on the Epilepsy Foundation of America's website for more information: http://www.epilepsyfoundation.org/answerplace/Social/driving/drivingu.cfm   5.  Maintain good sleep hygiene. Avoid alcohol.  6.  Contact your doctor if you have any problems that may be related to the medicine you are taking.  7.  Call 911 and bring the patient back to the ED if:        A.  The seizure lasts longer than 5 minutes.       B.  The patient doesn't awaken shortly after the seizure  C.  The patient has new problems such as difficulty seeing, speaking or moving  D.  The patient was injured during the seizure  E.  The patient has a temperature over 102 F (39C)  F.  The patient vomited and now is having trouble breathing         

## 2018-10-03 MED FILL — ANDRODERM 4 MG/24HR PT24: 4 | 30 days supply | Qty: 30 | Fill #0 | Status: TO

## 2018-10-17 MED FILL — HYDROCORTISONE 5 MG TABLET: 5 | 30 days supply | Qty: 90 | Fill #1

## 2018-10-18 MED FILL — SYNTHROID 112 MCG TABLET: 112 | 30 days supply | Qty: 30 | Fill #2 | Status: TO

## 2018-10-29 MED FILL — ANDRODERM 4 MG/24HR PT24: 4 | 30 days supply | Qty: 30 | Fill #0

## 2018-10-30 MED FILL — HYDROCORTISONE 10 MG TABLET: 10 | 90 days supply | Qty: 90 | Fill #0

## 2018-11-12 MED FILL — SYNTHROID 112 MCG TABLET: 112 | 30 days supply | Qty: 30 | Fill #0

## 2018-12-03 MED FILL — VIMPAT 200 MG TABLET: 200 | 90 days supply | Qty: 180 | Fill #0

## 2018-12-03 MED FILL — levETIRAcetam 750 MG TABS: 750 | 90 days supply | Qty: 360 | Fill #0

## 2018-12-06 MED FILL — LIDOCAINE-PRILOCAINE CREAM: 2.5-2.5 | 30 days supply | Qty: 30 | Fill #0

## 2018-12-10 MED FILL — ANDRODERM 4 MG/24HR PT24: 4 | 30 days supply | Qty: 30 | Fill #1

## 2018-12-14 DIAGNOSIS — Z6825 Body mass index (BMI) 25.0-25.9, adult: Secondary | ICD-10-CM | POA: Diagnosis not present

## 2018-12-14 DIAGNOSIS — G40409 Other generalized epilepsy and epileptic syndromes, not intractable, without status epilepticus: Secondary | ICD-10-CM | POA: Diagnosis not present

## 2018-12-14 DIAGNOSIS — Z0000 Encounter for general adult medical examination without abnormal findings: Secondary | ICD-10-CM | POA: Diagnosis not present

## 2018-12-14 DIAGNOSIS — E039 Hypothyroidism, unspecified: Secondary | ICD-10-CM | POA: Diagnosis not present

## 2018-12-24 MED FILL — SYNTHROID 112 MCG TABLET: 112 | 30 days supply | Qty: 30 | Fill #1

## 2019-01-14 MED FILL — ANDRODERM 4 MG/24HR PT24: 4 | 30 days supply | Qty: 30 | Fill #2

## 2019-01-22 MED FILL — HYDROCORTISONE 10 MG TABLET: 10 | 90 days supply | Qty: 90 | Fill #1

## 2019-01-22 MED FILL — SYNTHROID 112 MCG TABLET: 112 | 30 days supply | Qty: 30 | Fill #0

## 2019-02-14 MED FILL — ANDRODERM 4 MG/24HR PT24: 4 | 30 days supply | Qty: 30 | Fill #0

## 2019-02-15 MED FILL — SYNTHROID 112 MCG TABLET: 112 | 30 days supply | Qty: 30 | Fill #1

## 2019-02-19 MED FILL — DEXAMETHASONE 4 MG/ML VIAL: 4 | 1 days supply | Qty: 1 | Fill #0

## 2019-02-28 MED FILL — VIMPAT 200 MG TABLET: 200 | 90 days supply | Qty: 180 | Fill #0

## 2019-03-18 MED FILL — levETIRAcetam 750 MG TABS: 750 | 90 days supply | Qty: 360 | Fill #0

## 2019-03-25 MED FILL — SYNTHROID 112 MCG TABLET: 112 | 30 days supply | Qty: 30 | Fill #2

## 2019-03-28 MED FILL — ANDRODERM 4 MG/24HR PT24: 4 | 30 days supply | Qty: 30 | Fill #1

## 2019-04-08 DIAGNOSIS — Z13220 Encounter for screening for lipoid disorders: Secondary | ICD-10-CM | POA: Diagnosis not present

## 2019-04-08 DIAGNOSIS — E230 Hypopituitarism: Secondary | ICD-10-CM | POA: Diagnosis not present

## 2019-04-08 DIAGNOSIS — R946 Abnormal results of thyroid function studies: Secondary | ICD-10-CM | POA: Diagnosis not present

## 2019-04-08 DIAGNOSIS — G40409 Other generalized epilepsy and epileptic syndromes, not intractable, without status epilepticus: Secondary | ICD-10-CM | POA: Diagnosis not present

## 2019-04-08 DIAGNOSIS — E039 Hypothyroidism, unspecified: Secondary | ICD-10-CM | POA: Diagnosis not present

## 2019-04-08 DIAGNOSIS — E559 Vitamin D deficiency, unspecified: Secondary | ICD-10-CM | POA: Diagnosis not present

## 2019-04-11 DIAGNOSIS — E039 Hypothyroidism, unspecified: Secondary | ICD-10-CM | POA: Diagnosis not present

## 2019-04-11 DIAGNOSIS — Z6824 Body mass index (BMI) 24.0-24.9, adult: Secondary | ICD-10-CM | POA: Diagnosis not present

## 2019-04-11 DIAGNOSIS — E230 Hypopituitarism: Secondary | ICD-10-CM | POA: Diagnosis not present

## 2019-04-11 DIAGNOSIS — G40409 Other generalized epilepsy and epileptic syndromes, not intractable, without status epilepticus: Secondary | ICD-10-CM | POA: Diagnosis not present

## 2019-04-22 MED FILL — SYNTHROID 112 MCG TABLET: 112 | 30 days supply | Qty: 30 | Fill #3

## 2019-04-22 MED FILL — ANDRODERM 4 MG/24HR PT24: 4 | 30 days supply | Qty: 30 | Fill #2

## 2019-05-20 ENCOUNTER — Other Ambulatory Visit: Payer: Self-pay | Admitting: Neurology

## 2019-05-20 DIAGNOSIS — G40209 Localization-related (focal) (partial) symptomatic epilepsy and epileptic syndromes with complex partial seizures, not intractable, without status epilepticus: Secondary | ICD-10-CM

## 2019-05-20 MED ORDER — LACOSAMIDE 200 MG PO TABS: 200.0000 mg | ORAL_TABLET | Freq: Two times a day (BID) | ORAL | 3 refills | Status: DC

## 2019-05-20 MED FILL — ANDRODERM 4 MG/24HR PT24: 4 | 30 days supply | Qty: 30 | Fill #3

## 2019-05-20 MED FILL — SYNTHROID 112 MCG TABLET: 112 | 30 days supply | Qty: 30 | Fill #4

## 2019-05-20 MED FILL — VIMPAT 200 MG TABLET: 200 | 90 days supply | Qty: 180 | Fill #0

## 2019-06-03 MED FILL — HYDROCORTISONE 10 MG TABLET: 10 | 90 days supply | Qty: 90 | Fill #2

## 2019-06-17 MED FILL — SYNTHROID 112 MCG TABLET: 112 | 30 days supply | Qty: 30 | Fill #5

## 2019-06-17 MED FILL — levETIRAcetam 750 MG TABS: 750 | 90 days supply | Qty: 360 | Fill #1

## 2019-06-24 MED FILL — ANDRODERM 4 MG/24HR PT24: 4 | 30 days supply | Qty: 30 | Fill #4

## 2019-06-28 DIAGNOSIS — G40209 Localization-related (focal) (partial) symptomatic epilepsy and epileptic syndromes with complex partial seizures, not intractable, without status epilepticus: Secondary | ICD-10-CM

## 2019-06-28 MED ORDER — LEVETIRACETAM 750 MG PO TABS: 1500.0000 mg | ORAL_TABLET | Freq: Two times a day (BID) | ORAL | 3 refills | Status: DC

## 2019-07-15 MED FILL — SYNTHROID 112 MCG TABLET: 112 | 30 days supply | Qty: 30 | Fill #6

## 2019-07-24 MED FILL — ANDRODERM 4 MG/24HR PT24: 4 | 30 days supply | Qty: 30 | Fill #0

## 2019-07-24 MED FILL — HYDROCORTISONE 5 MG TABLET: 5 | 30 days supply | Qty: 90 | Fill #2

## 2019-08-19 MED FILL — SYNTHROID 112 MCG TABLET: 112 | 30 days supply | Qty: 30 | Fill #0

## 2019-08-19 MED FILL — VIMPAT 200 MG TABLET: 200 | 90 days supply | Qty: 180 | Fill #1

## 2019-08-22 MED FILL — ANDRODERM 4 MG/24HR PT24: 4 | 30 days supply | Qty: 30 | Fill #1

## 2019-09-16 MED FILL — levETIRAcetam 750 MG TABS: 750 | 90 days supply | Qty: 360 | Fill #0

## 2019-09-16 MED FILL — SYNTHROID 112 MCG TABLET: 112 | 30 days supply | Qty: 30 | Fill #1

## 2019-09-16 MED FILL — HYDROCORTISONE 5 MG TABLET: 5 | 10 days supply | Qty: 30 | Fill #0

## 2019-09-20 MED FILL — ANDRODERM 4 MG/24HR PT24: 4 | 30 days supply | Qty: 30 | Fill #2

## 2019-09-23 ENCOUNTER — Telehealth (INDEPENDENT_AMBULATORY_CARE_PROVIDER_SITE_OTHER): Payer: 59 | Admitting: Neurology

## 2019-09-23 ENCOUNTER — Encounter: Payer: Self-pay | Admitting: Neurology

## 2019-09-23 ENCOUNTER — Other Ambulatory Visit: Payer: Self-pay

## 2019-09-23 DIAGNOSIS — G40209 Localization-related (focal) (partial) symptomatic epilepsy and epileptic syndromes with complex partial seizures, not intractable, without status epilepticus: Secondary | ICD-10-CM

## 2019-09-23 MED ORDER — LEVETIRACETAM 750 MG PO TABS: 1500.0000 mg | ORAL_TABLET | Freq: Two times a day (BID) | ORAL | 3 refills | Status: DC

## 2019-09-23 MED ORDER — LACOSAMIDE 200 MG PO TABS: 200.0000 mg | ORAL_TABLET | Freq: Two times a day (BID) | ORAL | 3 refills | Status: DC

## 2019-09-23 NOTE — Progress Notes (Signed)
Virtual Visit via Video Note The purpose of this virtual visit is to provide medical care while limiting exposure to the novel coronavirus.    Consent was obtained for video visit:  Yes.   Answered questions that patient had about telehealth interaction:  Yes.   I discussed the limitations, risks, security and privacy concerns of performing an evaluation and management service by telemedicine. I also discussed with the patient that there may be a patient responsible charge related to this service. The patient expressed understanding and agreed to proceed.  Pt location: Home Physician Location: office Name of referring provider:  Richardean Chimera, MD I connected with Clayton Torres at patients initiation/request on 09/23/2019 at  4:00 PM EST by video enabled telemedicine application and verified that I am speaking with the correct person using two identifiers. Pt MRN:  [id number] Pt DOB:  [birth year] Video Participants:  Clayton Torres;  Clayton Torres   History of Present Illness:  The patient was seen as a virtual video visit on 09/23/2019. He was last seen a year ago for seizures. His mother is present during the visit to provide additional information. He continues to do well seizure-free since 2017 on Levetiracetam 750mg  2 tabs BID and Lacosamide 200mg  BID without side effects. He and his mother deny any staring/unresponsive episodes, gaps in time, focal numbness/tingling/weakness, myoclonic jerks. No headaches, dizziness, vision changes, no falls. Sleep and mood are good.   HPI: This is a pleasant 28 yo LH man with a history of congenital hydrocephalus s/p VP shunt at age 29 months and shunt revision in 2001, hypopituitarism, and seizures since 2006. The first seizure occurred in August 2006 in the setting of the patient not eating and being in a very hot environment. He hit the left side of his head and had a witnessed GTC. He has no recollection/warning that he could remember. He had an EEG at that  time and was told they could "see where the spike was," and was started on Dilantin. He was switched by his neurologist in 2007 to Keppra a few months after. He went over a year without any seizures and medication taper was discussed. Within 2 weeks, he had a seizure in October 2007 out of sleep. Keppra was resumed and he did well for almost 7 years without any seizures until January 2014 when he had another GTC and Keppra dose was increased. He had several seizures that year, in March 2014, June 2014, and October 2014. He recalls feeling dizzy with the seizure in 05/2013, he fell in the tub. He was taking Keppra 1500mg  BID and Oxtellar was added. He had hyponatremia with a sodium of 119 and Oxtellar was stopped. He was also tried on Aptiom which also caused hyponatremia. He was started on Vimpat in October 2014 and dose of 150mg  BID seemed to be the "magic combination" for him. His sodium levels stabilized between 135-138. Last Keppra level in March 2016 was 15. A Vimpat level in June 2015 was 29. He was again doing well with no seizures for almost 3 years until he had a witnessed convulsion on 10/15/15. He was at the computer writing a check, then his mother reported he did not answer her then turned red, followed by jerking and drooling for 5 minutes. No tongue bite or incontinence. After the seizure, he had sonorous respirations. He was also noted to break out in petechiae over his face. He was brought to [facility] ER where bloodwork where CBC and CMP  were normal, Keppra level was low at 4.8. He had a head CT without contrast which showed the right-sided transparietal VP catheter terminating just left of the midline, stable to prior study, no hydrocephalus. He had been seeing neurologist Dr. Jaynee Torres and Vimpat dose was increased to 200mg  BID. Most recent drug levels on 10/2015 showed a Keppra level of 13.7 (ref 10-40) and Vimpat level of 6.3 (ref 5-10).  They report that he got less sleep the night prior to the  recent seizure, with only 4-1/2 hours of sleep. He denied any missed medication doses, no recent infections or alcohol intake. His parents deny any episodes of staring/unresponsiveness. He denies any gaps in time, olfactory/gustatory hallucinations, deja vu, rising epigastric sensation, focal numbness/tingling/weakness, myoclonic jerks. He works at a [employer], he does not drive.   Epilepsy Risk Factors:  Patient was born premature at 68 months with congenital hydrocephalus s/p VP shunt. He has a history of panhypopituitarism. Otherwise no history of febrile convulsions, CNS infections such as meningitis/encephalitis, significant traumatic brain injury, or family history of seizures.  Diagnostic Data: I personally reviewed MRI brain with and without contrast which did not show any acute changes. 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 There is a tiny developmental venous anomaly is noted in the anterior left frontal lobe. His 1-hour sleep-deprived EEG was abnormal with occasional focal slowing over the left posterior temporal region, occasional epileptiform discharges over the left posterior temporal region, a single sharp transient was seen over the right frontal region.   Prior AEDs: Dilantin, generic Keppra (side effects and impaired seizure control per prior records)   Current Outpatient Medications on File Prior to Visit  Medication Sig Dispense Refill  . Aloe-Sodium Chloride (AYR SALINE NASAL GEL NA) Place 1 Dose into the nose daily.    Renae Gloss 4 MG/24HR PT24 patch Apply 4 mg topically daily.    . Ascorbic Acid (VITAMIN C) 1000 MG tablet Take 1,000 mg by mouth daily.     . Cholecalciferol (VITAMIN D-3 PO) Take 1,000 Units by mouth daily.    . ciclesonide (OMNARIS) 50 MCG/ACT nasal spray Place 2 sprays into both nostrils daily as needed for allergies.    . CORTEF 5 MG tablet Take 5 mg by mouth 2 (two) times daily. Will take 3 if increase stress    . Fexofenadine HCl (ALLEGRA PO) Take 180 mg by mouth daily. Every other day    . fexofenadine-pseudoephedrine (ALLEGRA-D 24) 180-240 MG 24 hr tablet Take 1 tablet by mouth. Every other day    . Multiple Vitamins tablet Take 1 tablet by mouth daily.    Marland Kitchen SYNTHROID 112 MCG tablet Take 112 mcg by mouth daily.    . fluticasone (FLONASE) 50 MCG/ACT nasal spray Place into both nostrils daily.     No current facility-administered medications on file prior to visit.     Observations/Objective:   Vitals:   09/23/19 1413  BP: 120/80  Pulse: 80  Resp: 20  SpO2: 95%  Weight: 187 lb 3.2 oz (84.9 kg)  Height: 5' 10.5" (1.791 m)   GEN:  The patient appears stated age and is in NAD.  Neurological examination: Patient is awake, alert, oriented x 3. No aphasia  or dysarthria. Intact fluency and comprehension. Remote and recent memory intact. Cranial nerves: Extraocular movements intact with no nystagmus. No facial asymmetry. Motor: moves all extremities symmetrically, at least anti-gravity x 4. No incoordination on finger to nose testing. Gait: narrow-based and steady, able to tandem walk adequately.   Assessment and Plan:   This is a pleasant 29 yo LH man with a history of congenital hydrocephalus s/p VP shunt, panhypopituitarism, and focal to bilateral tonic-clonic epilepsy since 2006. All his seizures have been convulsive seizures. 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 He has been seizure-free since March 2017, refills sent for Levetiracetam 1500mg  BID and Vimpat 200mg  BID. He does not  drive. He will follow-up in 1 year and knows to call our office for any problems  Thank you for allowing me to participate in his care.  Please do not hesitate to call for any questions or concerns.   Follow Up Instructions:   -I discussed the assessment and treatment plan with the patient. The patient was provided an opportunity to ask questions and all were answered. The patient agreed with the plan and demonstrated an understanding of the instructions.   The patient was advised to call back or seek an in-person evaluation if the symptoms worsen or if the condition fails to improve as anticipated.    , MD

## 2019-10-24 MED FILL — ANDRODERM 4 MG/24HR PT24: 4 | 30 days supply | Qty: 30 | Fill #3

## 2019-10-24 MED FILL — HYDROCORTISONE 5 MG TABLET: 5 | 20 days supply | Qty: 60 | Fill #0

## 2019-10-24 MED FILL — SYNTHROID 112 MCG TABLET: 112 | 30 days supply | Qty: 30 | Fill #2

## 2019-11-15 MED FILL — VIMPAT 200 MG TABLET: 200 | 90 days supply | Qty: 180 | Fill #2

## 2019-11-20 MED FILL — HYDROCORTISONE 5 MG TABLET: 5 | 20 days supply | Qty: 60 | Fill #1

## 2019-11-20 MED FILL — SYNTHROID 112 MCG TABLET: 112 | 30 days supply | Qty: 30 | Fill #3

## 2019-11-25 MED FILL — ANDRODERM 4 MG/24HR PT24: 4 | 30 days supply | Qty: 30 | Fill #4

## 2019-12-12 DIAGNOSIS — Z0000 Encounter for general adult medical examination without abnormal findings: Secondary | ICD-10-CM | POA: Diagnosis not present

## 2019-12-13 MED FILL — levETIRAcetam 750 MG TABS: 750 | 90 days supply | Qty: 360 | Fill #1

## 2019-12-16 DIAGNOSIS — Z6825 Body mass index (BMI) 25.0-25.9, adult: Secondary | ICD-10-CM | POA: Diagnosis not present

## 2019-12-16 DIAGNOSIS — E039 Hypothyroidism, unspecified: Secondary | ICD-10-CM | POA: Diagnosis not present

## 2019-12-16 DIAGNOSIS — G40409 Other generalized epilepsy and epileptic syndromes, not intractable, without status epilepticus: Secondary | ICD-10-CM | POA: Diagnosis not present

## 2019-12-16 DIAGNOSIS — Z0001 Encounter for general adult medical examination with abnormal findings: Secondary | ICD-10-CM | POA: Diagnosis not present

## 2019-12-16 DIAGNOSIS — E230 Hypopituitarism: Secondary | ICD-10-CM | POA: Diagnosis not present

## 2019-12-24 MED FILL — ANDRODERM 4 MG/24HR PT24: 4 | 30 days supply | Qty: 30 | Fill #5

## 2020-01-29 MED FILL — ANDRODERM 4 MG/24HR PT24: 4 | 30 days supply | Qty: 30 | Fill #0

## 2020-02-06 DIAGNOSIS — Q038 Other congenital hydrocephalus: Secondary | ICD-10-CM | POA: Diagnosis not present

## 2020-02-06 DIAGNOSIS — H5213 Myopia, bilateral: Secondary | ICD-10-CM | POA: Diagnosis not present

## 2020-02-06 DIAGNOSIS — H47393 Other disorders of optic disc, bilateral: Secondary | ICD-10-CM | POA: Diagnosis not present

## 2020-02-12 MED FILL — VIMPAT 200 MG TABLET: 200 | 90 days supply | Qty: 180 | Fill #0

## 2020-02-12 MED FILL — SYNTHROID 112 MCG TABLET: 112 | 30 days supply | Qty: 30 | Fill #6

## 2020-02-12 MED FILL — HYDROCORTISONE 5 MG TABS: 5 | 20 days supply | Qty: 60 | Fill #4

## 2020-03-03 MED FILL — ANDRODERM 4 MG/24HR PT24: 4 | 30 days supply | Qty: 30 | Fill #1

## 2020-03-03 MED FILL — HYDROCORTISONE 5 MG TABLET: 5 | 20 days supply | Qty: 60 | Fill #5

## 2020-03-11 MED FILL — SYNTHROID 112 MCG TABLET: 112 | 30 days supply | Qty: 30 | Fill #7

## 2020-03-11 MED FILL — levETIRAcetam 750 MG TABS: 750 | 90 days supply | Qty: 360 | Fill #2

## 2020-03-24 MED FILL — HYDROCORTISONE 5 MG TABLET: 5 | 20 days supply | Qty: 60 | Fill #6

## 2020-04-08 MED FILL — SYNTHROID 112 MCG TABLET: 112 | 30 days supply | Qty: 30 | Fill #8

## 2020-04-08 MED FILL — DEXAMETHASONE 4 MG/ML VIAL: 4 | 1 days supply | Qty: 1 | Fill #0

## 2020-04-08 MED FILL — ANDRODERM 4 MG/24HR PT24: 4 | 30 days supply | Qty: 30 | Fill #0

## 2020-04-09 MED FILL — HYDROCORTISONE 5 MG TABLET: 5 | 20 days supply | Qty: 60 | Fill #7

## 2020-04-30 DIAGNOSIS — Z79899 Other long term (current) drug therapy: Secondary | ICD-10-CM | POA: Diagnosis not present

## 2020-04-30 DIAGNOSIS — E559 Vitamin D deficiency, unspecified: Secondary | ICD-10-CM | POA: Diagnosis not present

## 2020-04-30 DIAGNOSIS — E2749 Other adrenocortical insufficiency: Secondary | ICD-10-CM | POA: Diagnosis not present

## 2020-04-30 DIAGNOSIS — E230 Hypopituitarism: Secondary | ICD-10-CM | POA: Diagnosis not present

## 2020-04-30 DIAGNOSIS — E038 Other specified hypothyroidism: Secondary | ICD-10-CM | POA: Diagnosis not present

## 2020-05-07 ENCOUNTER — Other Ambulatory Visit: Payer: Self-pay | Admitting: Neurology

## 2020-05-07 DIAGNOSIS — G40209 Localization-related (focal) (partial) symptomatic epilepsy and epileptic syndromes with complex partial seizures, not intractable, without status epilepticus: Secondary | ICD-10-CM

## 2020-05-07 MED FILL — SYNTHROID 112 MCG TABLET: 112 | 30 days supply | Qty: 30 | Fill #9

## 2020-05-07 MED FILL — HYDROCORTISONE 5 MG TABLET: 5 | 20 days supply | Qty: 60 | Fill #8

## 2020-05-07 MED FILL — ANDRODERM 4 MG/24HR PT24: 4 | 30 days supply | Qty: 30 | Fill #1

## 2020-05-25 MED FILL — HYDROCORTISONE 5 MG TABLET: 5 | 20 days supply | Qty: 60 | Fill #9

## 2020-05-25 MED FILL — VIMPAT 200 MG TABLET: 200 | 90 days supply | Qty: 180 | Fill #0

## 2020-06-03 MED FILL — ANDRODERM 4 MG/24HR PT24: 4 | 30 days supply | Qty: 30 | Fill #2

## 2020-06-03 MED FILL — SYNTHROID 112 MCG TABLET: 112 | 30 days supply | Qty: 30 | Fill #10

## 2020-06-11 DIAGNOSIS — E039 Hypothyroidism, unspecified: Secondary | ICD-10-CM | POA: Diagnosis not present

## 2020-06-11 DIAGNOSIS — G40409 Other generalized epilepsy and epileptic syndromes, not intractable, without status epilepticus: Secondary | ICD-10-CM | POA: Diagnosis not present

## 2020-06-11 DIAGNOSIS — E230 Hypopituitarism: Secondary | ICD-10-CM | POA: Diagnosis not present

## 2020-06-11 DIAGNOSIS — Z13220 Encounter for screening for lipoid disorders: Secondary | ICD-10-CM | POA: Diagnosis not present

## 2020-06-12 DIAGNOSIS — E039 Hypothyroidism, unspecified: Secondary | ICD-10-CM | POA: Diagnosis not present

## 2020-06-12 DIAGNOSIS — Z13220 Encounter for screening for lipoid disorders: Secondary | ICD-10-CM | POA: Diagnosis not present

## 2020-06-12 DIAGNOSIS — G40409 Other generalized epilepsy and epileptic syndromes, not intractable, without status epilepticus: Secondary | ICD-10-CM | POA: Diagnosis not present

## 2020-06-12 DIAGNOSIS — E230 Hypopituitarism: Secondary | ICD-10-CM | POA: Diagnosis not present

## 2020-06-17 MED FILL — levETIRAcetam 750 MG TABS: 750 | 90 days supply | Qty: 360 | Fill #3

## 2020-06-18 DIAGNOSIS — Z1331 Encounter for screening for depression: Secondary | ICD-10-CM | POA: Diagnosis not present

## 2020-06-18 DIAGNOSIS — Z6825 Body mass index (BMI) 25.0-25.9, adult: Secondary | ICD-10-CM | POA: Diagnosis not present

## 2020-06-18 DIAGNOSIS — E230 Hypopituitarism: Secondary | ICD-10-CM | POA: Diagnosis not present

## 2020-06-18 DIAGNOSIS — G40409 Other generalized epilepsy and epileptic syndromes, not intractable, without status epilepticus: Secondary | ICD-10-CM | POA: Diagnosis not present

## 2020-06-18 DIAGNOSIS — Z1389 Encounter for screening for other disorder: Secondary | ICD-10-CM | POA: Diagnosis not present

## 2020-06-18 DIAGNOSIS — E039 Hypothyroidism, unspecified: Secondary | ICD-10-CM | POA: Diagnosis not present

## 2020-06-30 MED FILL — SYNTHROID 112 MCG TABLET: 112 | 30 days supply | Qty: 30 | Fill #11

## 2020-06-30 MED FILL — HYDROCORTISONE 5 MG TABLET: 5 | 20 days supply | Qty: 60 | Fill #10

## 2020-07-07 MED FILL — ANDRODERM 4 MG/24HR PT24: 4 | 30 days supply | Qty: 30 | Fill #3

## 2020-07-22 DIAGNOSIS — Z79899 Other long term (current) drug therapy: Secondary | ICD-10-CM | POA: Diagnosis not present

## 2020-07-22 DIAGNOSIS — E2749 Other adrenocortical insufficiency: Secondary | ICD-10-CM | POA: Diagnosis not present

## 2020-07-22 DIAGNOSIS — E038 Other specified hypothyroidism: Secondary | ICD-10-CM | POA: Diagnosis not present

## 2020-07-22 DIAGNOSIS — E230 Hypopituitarism: Secondary | ICD-10-CM | POA: Diagnosis not present

## 2020-07-22 DIAGNOSIS — E871 Hypo-osmolality and hyponatremia: Secondary | ICD-10-CM | POA: Diagnosis not present

## 2020-07-23 ENCOUNTER — Other Ambulatory Visit: Payer: Self-pay | Admitting: Neurology

## 2020-07-23 DIAGNOSIS — G40209 Localization-related (focal) (partial) symptomatic epilepsy and epileptic syndromes with complex partial seizures, not intractable, without status epilepticus: Secondary | ICD-10-CM

## 2020-07-23 MED FILL — HYDROCORTISONE 5 MG TABLET: 5 | 20 days supply | Qty: 60 | Fill #11

## 2020-07-29 ENCOUNTER — Other Ambulatory Visit (HOSPITAL_COMMUNITY): Payer: Self-pay | Admitting: Medical

## 2020-07-29 MED FILL — SYNTHROID 112 MCG TABLET: 112 | 90 days supply | Qty: 90 | Fill #0

## 2020-08-04 ENCOUNTER — Other Ambulatory Visit (HOSPITAL_COMMUNITY): Payer: Self-pay | Admitting: Endocrinology

## 2020-08-04 MED FILL — ANDRODERM 4 MG/24HR PT24: 4 | 30 days supply | Qty: 30 | Fill #0

## 2020-08-10 MED FILL — HYDROCORTISONE 5 MG TABLET: 5 | 23 days supply | Qty: 70 | Fill #0

## 2020-08-14 ENCOUNTER — Other Ambulatory Visit (HOSPITAL_COMMUNITY): Payer: Self-pay | Admitting: Endocrinology

## 2020-08-26 MED FILL — VIMPAT 200 MG TABLET: 200 | 90 days supply | Qty: 180 | Fill #1

## 2020-08-28 MED FILL — HYDROCORTISONE 5 MG TABLET: 5 | 30 days supply | Qty: 90 | Fill #0

## 2020-09-03 MED FILL — ANDRODERM 4 MG/24HR PT24: 4 | 30 days supply | Qty: 30 | Fill #1

## 2020-09-09 MED FILL — levETIRAcetam 750 MG TABS: 750 | 90 days supply | Qty: 360 | Fill #0

## 2020-09-16 DIAGNOSIS — Z8669 Personal history of other diseases of the nervous system and sense organs: Secondary | ICD-10-CM | POA: Diagnosis not present

## 2020-09-16 DIAGNOSIS — G919 Hydrocephalus, unspecified: Secondary | ICD-10-CM | POA: Diagnosis not present

## 2020-09-25 ENCOUNTER — Ambulatory Visit: Payer: 59 | Admitting: Neurology

## 2020-09-25 ENCOUNTER — Encounter: Payer: Self-pay | Admitting: Neurology

## 2020-09-25 ENCOUNTER — Other Ambulatory Visit: Payer: Self-pay | Admitting: Neurology

## 2020-09-25 ENCOUNTER — Other Ambulatory Visit: Payer: Self-pay

## 2020-09-25 DIAGNOSIS — G40209 Localization-related (focal) (partial) symptomatic epilepsy and epileptic syndromes with complex partial seizures, not intractable, without status epilepticus: Secondary | ICD-10-CM

## 2020-09-25 MED ORDER — LACOSAMIDE 200 MG PO TABS
200.0000 mg | ORAL_TABLET | Freq: Two times a day (BID) | ORAL | 3 refills | Status: DC
Start: 2020-09-25 — End: 2020-09-25

## 2020-09-25 MED ORDER — LEVETIRACETAM 750 MG PO TABS
1500.0000 mg | ORAL_TABLET | Freq: Two times a day (BID) | ORAL | 3 refills | Status: DC
Start: 2020-09-25 — End: 2020-09-25

## 2020-09-25 NOTE — Patient Instructions (Signed)
Always good to see you. Continue all your medications, refills have been sent. Follow-up in 1 year, call for any changes.    Seizure Precautions: 1. If medication has been prescribed for you to prevent seizures, take it exactly as directed.  Do not stop taking the medicine without talking to your doctor first, even if you have not had a seizure in a long time.   2. Avoid activities in which a seizure would cause danger to yourself or to others.  Don't operate dangerous machinery, swim alone, or climb in high or dangerous places, such as on ladders, roofs, or girders.  Do not drive unless your doctor says you may.  3. If you have any warning that you may have a seizure, lay down in a safe place where you can't hurt yourself.    4.  No driving for 6 months from last seizure, as per [facility].   Please refer to the following link on the Epilepsy Foundation of America's website for more information: http://www.epilepsyfoundation.org/answerplace/Social/driving/drivingu.cfm   5.  Maintain good sleep hygiene. Avoid alcohol.  6.  Contact your doctor if you have any problems that may be related to the medicine you are taking.  7.  Call 911 and bring the patient back to the ED if:        A.  The seizure lasts longer than 5 minutes.       B.  The patient doesn't awaken shortly after the seizure  C.  The patient has new problems such as difficulty seeing, speaking or moving  D.  The patient was injured during the seizure  E.  The patient has a temperature over 102 F (39C)  F.  The patient vomited and now is having trouble breathing

## 2020-09-25 NOTE — Progress Notes (Signed)
NEUROLOGY FOLLOW UP OFFICE NOTE  Clayton Torres [id number] [birth year]  HISTORY OF PRESENT ILLNESS: I had the pleasure of seeing Clayton Torres in follow-up in the neurology clinic on 09/25/2020.  The patient was last seen a year ago for seizures. He is again accompanied by his father who helps supplement the history today. He continues to do well seizure-free since 2017 on Levetiracetam 750mg  2 tabs BID and Lacosamide 200mg  BID without side effects. He and his father deny any staring/unresponsive episodes, gaps in time, olfactory/gustatory hallucinations, focal numbness/tingling/weakness, myoclonic jerks. He has occasional mild headaches, no dizziness, vision changes, no falls. Sleep and mood are good. He follows up at [facility] for his shunt and had a head CT without contrast at [facility] on 09/16/20, no acute changes, stable right parietal approach ventriculostomy catheter with slight interval reduction in size of the lateral ventricles.   HPI: This is a pleasant 30 yo LH man with a history of congenital hydrocephalus s/p VP shunt at age 18 months and shunt revision in 2001, hypopituitarism, and seizures since 2006. The first seizure occurred in August 2006 in the setting of the patient not eating and being in a very hot environment. He hit the left side of his head and had a witnessed GTC. He has no recollection/warning that he could remember. He had an EEG at that time and was told they could "see where the spike was," and was started on Dilantin. He was switched by his neurologist in 2007 to Keppra a few months after. He went over a year without any seizures and medication taper was discussed. Within 2 weeks, he had a seizure in October 2007 out of sleep. Keppra was resumed and he did well for almost 7 years without any seizures until January 2014 when he had another GTC and Keppra dose was increased. He had several seizures that year, in March 2014, June 2014, and October 2014. He recalls feeling  dizzy with the seizure in 05/2013, he fell in the tub. He was taking Keppra 1500mg  BID and Oxtellar was added. He had hyponatremia with a sodium of 119 and Oxtellar was stopped. He was also tried on Aptiom which also caused hyponatremia. He was started on Vimpat in October 2014 and dose of 150mg  BID seemed to be the "magic combination" for him. His sodium levels stabilized between 135-138. Last Keppra level in March 2016 was 15. A Vimpat level in June 2015 was 29. He was again doing well with no seizures for almost 3 years until he had a witnessed convulsion on 10/15/15. He was at the computer writing a check, then his mother reported he did not answer her then turned red, followed by jerking and drooling for 5 minutes. No tongue bite or incontinence. After the seizure, he had sonorous respirations. He was also noted to break out in petechiae over his face. He was brought to [facility] ER where bloodwork where CBC and CMP were normal, Keppra level was low at 4.8. He had a head CT without contrast which showed the right-sided transparietal VP catheter terminating just left of the midline, stable to prior study, no hydrocephalus. He had been seeing neurologist Dr. April 2016 and Vimpat dose was increased to 200mg  BID. Most recent drug levels on 10/2015 showed a Keppra level of 13.7 (ref 10-40) and Vimpat level of 6.3 (ref 5-10).  They report that he got less sleep the night prior to the recent seizure, with only 4-1/2 hours of sleep. He denied any missed medication  doses, no recent infections or alcohol intake. His parents deny any episodes of staring/unresponsiveness. He denies any gaps in time, olfactory/gustatory hallucinations, deja vu, rising epigastric sensation, focal numbness/tingling/weakness, myoclonic jerks. He works at a [employer], he does not drive.   Epilepsy Risk Factors:  Patient was born premature at 7 months with congenital hydrocephalus s/p VP shunt. He has a history of panhypopituitarism.  Otherwise no history of febrile convulsions, CNS infections such as meningitis/encephalitis, significant traumatic brain injury, or family history of seizures.  Diagnostic Data: I personally reviewed MRI brain with and without contrast which did not show any acute changes. 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 There is a tiny developmental venous anomaly is noted in the anterior left frontal lobe. His 1-hour sleep-deprived EEG was abnormal with occasional focal slowing over the left posterior temporal region, occasional epileptiform discharges over the left posterior temporal region, a single sharp transient was seen over the right frontal region.   Prior AEDs: Dilantin, generic Keppra (side effects and impaired seizure control per prior records)  PAST MEDICAL HISTORY: Past Medical History:  Diagnosis Date  . COVID-19   . Hydrocephalus (HCC)   . Seizure disorder [facility])     MEDICATIONS: Current Outpatient Medications on File Prior to Visit  Medication Sig Dispense Refill  . Aloe-Sodium Chloride (AYR SALINE NASAL GEL NA) Place 1 Dose into the nose daily.    Cathren Laine 4 MG/24HR PT24 patch Apply 4 mg topically daily.    . Ascorbic Acid (VITAMIN C) 1000 MG tablet Take 1,000 mg by mouth daily.    . Cholecalciferol (VITAMIN D-3 PO) Take 1,000 Units by mouth daily.    . CORTEF 5 MG tablet Take 5 mg by mouth 2 (two) times daily. Will take 3 if increase stress    . Fexofenadine HCl (ALLEGRA PO) Take 180 mg by mouth daily. Every other day    . fexofenadine-pseudoephedrine (ALLEGRA-D 24) 180-240 MG 24 hr tablet Take 1 tablet by mouth. Every other day    . fluticasone (FLONASE) 50 MCG/ACT  nasal spray Place into both nostrils as needed.    . levETIRAcetam (KEPPRA) 750 MG tablet TAKE 2 TABLETS (1,500 MG TOTAL) BY MOUTH 2 (TWO) TIMES DAILY. 2 TABS 8AM 2 TABS 8PM 360 tablet 0  . Multiple Vitamins tablet Take 1 tablet by mouth daily.    Marland Kitchen SYNTHROID 112 MCG tablet Take 112 mcg by mouth daily.    Marland Kitchen VIMPAT 200 MG TABS tablet TAKE 1 TABLET (200 MG TOTAL) BY MOUTH 2 (TWO) TIMES DAILY. 180 tablet 3   No current facility-administered medications on file prior to visit.    ALLERGIES: Allergies  Allergen Reactions  . Benzonatate Diarrhea  . Ceftriaxone Hives  . Eslicarbazepine Other (See Comments)    Low soduim  . Oxcarbazepine Other (See Comments)    Low soduim  . Testosterone Rash    FAMILY HISTORY: Family History  Problem Relation Age of Onset  . Hypertension Other   . Diabetes Other   . CAD Other   . COPD Other   . Cancer Paternal Grandmother   . Cancer Paternal Grandfather     SOCIAL HISTORY: Social History   Socioeconomic History  . Marital status:  Single    Spouse name: Not on file  . Number of children: Not on file  . Years of education: Not on file  . Highest education level: Not on file  Occupational History  . Occupation: Futures trader  Tobacco Use  . Smoking status: Never Smoker  . Smokeless tobacco: Never Used  Vaping Use  . Vaping Use: Never used  Substance and Sexual Activity  . Alcohol use: No    Alcohol/week: 0.0 standard drinks  . Drug use: No  . Sexual activity: Not on file  Other Topics Concern  . Not on file  Social History Narrative   Lives w/ parents   Caffeine use: 1 cup coffee per day      PA's: [phone]      Left handed    Social Determinants of Health   Financial Resource Strain: Not on file  Food Insecurity: Not on file  Transportation Needs: Not on file  Physical Activity: Not on file  Stress: Not on file  Social Connections: Not on file  Intimate Partner Violence: Not on file     PHYSICAL  EXAM: Vitals:   09/25/20 1457  BP: 110/76  Pulse: 88  SpO2: 97%   General: No acute distress Head:  Normocephalic/atraumatic Skin/Extremities: No rash, no edema Neurological Exam: alert and awake. No aphasia or dysarthria. Fund of knowledge is appropriate.  Recent and remote memory are intact.  Attention and concentration are normal.   Cranial nerves: Pupils equal, round. Extraocular movements intact with no nystagmus. Visual fields full.  No facial asymmetry.  Motor: Bulk and tone normal, muscle strength 5/5 throughout with no pronator drift.   Finger to nose testing intact.  Gait narrow-based and steady, slight difficulty with tandem walk.  Romberg negative.   IMPRESSION: This is a pleasant 30 yo LH man with a history of congenital hydrocephalus s/p VP shunt, panhypopituitarism, and focal to bilateral tonic-clonic epilepsy since 2006. All his seizures have been convulsive seizures. 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 He continues to be seizure-free since March 2017 on Levetiracetam 750mg  2 tabs BID (1500mg  BID) and Lacosamide 200mg  BID, refills sent. He does not drive. Follow-up in 1 year, he knows to call for any changes.    Thank you for allowing me to participate in his care.  Please do not hesitate to call for any questions or concerns.   , M.D.   CC: Dr. 

## 2020-10-09 MED FILL — ANDRODERM 4 MG/24HR PT24: 4 | 30 days supply | Qty: 30 | Fill #2

## 2020-10-09 MED FILL — HYDROCORTISONE 5 MG TABLET: 5 | 30 days supply | Qty: 90 | Fill #1

## 2020-10-21 ENCOUNTER — Other Ambulatory Visit (HOSPITAL_BASED_OUTPATIENT_CLINIC_OR_DEPARTMENT_OTHER): Payer: Self-pay

## 2020-10-27 MED FILL — SYNTHROID 112 MCG TABLET: 112 | 90 days supply | Qty: 90 | Fill #1

## 2020-11-07 ENCOUNTER — Other Ambulatory Visit (HOSPITAL_COMMUNITY): Payer: Self-pay

## 2020-11-09 ENCOUNTER — Other Ambulatory Visit (HOSPITAL_COMMUNITY): Payer: Self-pay

## 2020-11-09 MED FILL — Levetiracetam Tab 750 MG: ORAL | 90 days supply | Qty: 360 | Fill #0 | Status: CN

## 2020-11-09 MED FILL — Hydrocortisone Tab 5 MG: ORAL | 30 days supply | Qty: 90 | Fill #0 | Status: AC

## 2020-11-10 ENCOUNTER — Other Ambulatory Visit (HOSPITAL_COMMUNITY): Payer: Self-pay

## 2020-11-10 MED ORDER — ANDRODERM 4 MG/24HR TD PT24
1.0000 | MEDICATED_PATCH | Freq: Every evening | TRANSDERMAL | 4 refills | Status: DC
  Filled 2020-11-10: qty 30, 30d supply, fill #0
  Filled 2020-12-09: qty 30, 30d supply, fill #1
  Filled 2021-01-11: qty 30, 30d supply, fill #2
  Filled 2021-02-09: qty 30, 30d supply, fill #3
  Filled 2021-03-11: qty 30, 30d supply, fill #4
  Filled 2021-04-09 (×2): qty 30, 30d supply, fill #5

## 2020-11-11 ENCOUNTER — Other Ambulatory Visit (HOSPITAL_COMMUNITY): Payer: Self-pay

## 2020-11-11 MED ORDER — ANDRODERM 4 MG/24HR TD PT24
1.0000 | MEDICATED_PATCH | Freq: Every evening | TRANSDERMAL | 2 refills | Status: DC
  Filled 2020-11-11: qty 30, 30d supply, fill #0

## 2020-11-12 ENCOUNTER — Other Ambulatory Visit (HOSPITAL_COMMUNITY): Payer: Self-pay

## 2020-11-17 ENCOUNTER — Other Ambulatory Visit (HOSPITAL_COMMUNITY): Payer: Self-pay

## 2020-11-23 ENCOUNTER — Other Ambulatory Visit (HOSPITAL_COMMUNITY): Payer: Self-pay

## 2020-11-23 MED FILL — Levetiracetam Tab 750 MG: ORAL | 90 days supply | Qty: 360 | Fill #0 | Status: AC

## 2020-11-23 MED FILL — Lacosamide Tab 200 MG: ORAL | 90 days supply | Qty: 180 | Fill #0 | Status: AC

## 2020-12-09 ENCOUNTER — Other Ambulatory Visit (HOSPITAL_COMMUNITY): Payer: Self-pay

## 2020-12-11 DIAGNOSIS — E559 Vitamin D deficiency, unspecified: Secondary | ICD-10-CM | POA: Diagnosis not present

## 2020-12-11 DIAGNOSIS — Z13220 Encounter for screening for lipoid disorders: Secondary | ICD-10-CM | POA: Diagnosis not present

## 2020-12-11 DIAGNOSIS — Z0001 Encounter for general adult medical examination with abnormal findings: Secondary | ICD-10-CM | POA: Diagnosis not present

## 2020-12-11 DIAGNOSIS — E230 Hypopituitarism: Secondary | ICD-10-CM | POA: Diagnosis not present

## 2020-12-11 DIAGNOSIS — E039 Hypothyroidism, unspecified: Secondary | ICD-10-CM | POA: Diagnosis not present

## 2020-12-16 DIAGNOSIS — G40409 Other generalized epilepsy and epileptic syndromes, not intractable, without status epilepticus: Secondary | ICD-10-CM | POA: Diagnosis not present

## 2020-12-16 DIAGNOSIS — Z6825 Body mass index (BMI) 25.0-25.9, adult: Secondary | ICD-10-CM | POA: Diagnosis not present

## 2020-12-16 DIAGNOSIS — E039 Hypothyroidism, unspecified: Secondary | ICD-10-CM | POA: Diagnosis not present

## 2020-12-16 DIAGNOSIS — Z0001 Encounter for general adult medical examination with abnormal findings: Secondary | ICD-10-CM | POA: Diagnosis not present

## 2020-12-16 DIAGNOSIS — E230 Hypopituitarism: Secondary | ICD-10-CM | POA: Diagnosis not present

## 2020-12-22 ENCOUNTER — Other Ambulatory Visit (HOSPITAL_COMMUNITY): Payer: Self-pay

## 2020-12-22 MED FILL — Hydrocortisone Tab 5 MG: ORAL | 30 days supply | Qty: 90 | Fill #1 | Status: AC

## 2020-12-22 MED FILL — Hydrocortisone Tab 5 MG: ORAL | 30 days supply | Qty: 90 | Fill #1 | Status: CN

## 2021-01-11 ENCOUNTER — Other Ambulatory Visit (HOSPITAL_COMMUNITY): Payer: Self-pay

## 2021-01-25 ENCOUNTER — Other Ambulatory Visit (HOSPITAL_COMMUNITY): Payer: Self-pay

## 2021-01-25 MED FILL — Hydrocortisone Tab 5 MG: ORAL | 30 days supply | Qty: 90 | Fill #2 | Status: AC

## 2021-01-25 MED FILL — Levothyroxine Sodium Tab 112 MCG: ORAL | 90 days supply | Qty: 90 | Fill #0 | Status: AC

## 2021-02-09 ENCOUNTER — Other Ambulatory Visit (HOSPITAL_COMMUNITY): Payer: Self-pay

## 2021-02-09 DIAGNOSIS — H25093 Other age-related incipient cataract, bilateral: Secondary | ICD-10-CM | POA: Diagnosis not present

## 2021-02-22 ENCOUNTER — Other Ambulatory Visit (HOSPITAL_COMMUNITY): Payer: Self-pay

## 2021-02-22 MED FILL — Lacosamide Tab 200 MG: ORAL | 30 days supply | Qty: 60 | Fill #1 | Status: CN

## 2021-02-22 MED FILL — Levetiracetam Tab 750 MG: ORAL | 90 days supply | Qty: 360 | Fill #1 | Status: AC

## 2021-02-23 ENCOUNTER — Other Ambulatory Visit (HOSPITAL_COMMUNITY): Payer: Self-pay

## 2021-02-23 DIAGNOSIS — G40209 Localization-related (focal) (partial) symptomatic epilepsy and epileptic syndromes with complex partial seizures, not intractable, without status epilepticus: Secondary | ICD-10-CM

## 2021-02-23 MED ORDER — LACOSAMIDE 200 MG PO TABS
200.0000 mg | ORAL_TABLET | Freq: Two times a day (BID) | ORAL | 3 refills | Status: DC
  Filled 2021-02-23: qty 180, 90d supply, fill #0
  Filled 2021-05-10 – 2021-05-24 (×2): qty 180, 90d supply, fill #1
  Filled 2021-08-19: qty 180, 90d supply, fill #2

## 2021-02-23 MED FILL — Lacosamide Tab 200 MG: ORAL | 30 days supply | Qty: 60 | Fill #1 | Status: CN

## 2021-02-24 ENCOUNTER — Other Ambulatory Visit (HOSPITAL_COMMUNITY): Payer: Self-pay

## 2021-03-10 ENCOUNTER — Other Ambulatory Visit (HOSPITAL_COMMUNITY): Payer: Self-pay

## 2021-03-11 ENCOUNTER — Other Ambulatory Visit (HOSPITAL_COMMUNITY): Payer: Self-pay

## 2021-03-11 MED ORDER — HYDROCORTISONE 5 MG PO TABS
ORAL_TABLET | ORAL | 3 refills | Status: DC
  Filled 2021-03-11: qty 90, 30d supply, fill #0
  Filled 2021-04-26: qty 90, 30d supply, fill #1
  Filled 2021-06-11 – 2021-06-28 (×2): qty 90, 30d supply, fill #2
  Filled 2021-08-04: qty 90, 30d supply, fill #3

## 2021-04-09 ENCOUNTER — Other Ambulatory Visit (HOSPITAL_COMMUNITY): Payer: Self-pay

## 2021-04-26 ENCOUNTER — Other Ambulatory Visit (HOSPITAL_COMMUNITY): Payer: Self-pay

## 2021-04-27 ENCOUNTER — Other Ambulatory Visit (HOSPITAL_COMMUNITY): Payer: Self-pay

## 2021-04-27 DIAGNOSIS — Z79899 Other long term (current) drug therapy: Secondary | ICD-10-CM | POA: Diagnosis not present

## 2021-04-27 DIAGNOSIS — E230 Hypopituitarism: Secondary | ICD-10-CM | POA: Diagnosis not present

## 2021-04-27 DIAGNOSIS — E2749 Other adrenocortical insufficiency: Secondary | ICD-10-CM | POA: Diagnosis not present

## 2021-04-27 DIAGNOSIS — E559 Vitamin D deficiency, unspecified: Secondary | ICD-10-CM | POA: Diagnosis not present

## 2021-04-27 DIAGNOSIS — E038 Other specified hypothyroidism: Secondary | ICD-10-CM | POA: Diagnosis not present

## 2021-04-27 MED ORDER — ANDRODERM 4 MG/24HR TD PT24
1.0000 | MEDICATED_PATCH | Freq: Every day | TRANSDERMAL | 5 refills | Status: DC
  Filled 2021-04-27 – 2021-05-10 (×2): qty 30, 30d supply, fill #0
  Filled 2021-06-07: qty 30, 30d supply, fill #1
  Filled 2021-07-12: qty 30, 30d supply, fill #2
  Filled 2021-08-12: qty 30, 30d supply, fill #3
  Filled 2021-09-10: qty 30, 30d supply, fill #4
  Filled 2021-10-12: qty 30, 30d supply, fill #5

## 2021-04-27 MED ORDER — SYNTHROID 112 MCG PO TABS
ORAL_TABLET | ORAL | 4 refills | Status: DC
  Filled 2021-04-27: qty 90, 90d supply, fill #0
  Filled 2021-07-12: qty 90, 90d supply, fill #1
  Filled 2021-09-29: qty 90, 90d supply, fill #2
  Filled 2022-01-11: qty 90, 90d supply, fill #3
  Filled 2022-04-07: qty 90, 90d supply, fill #4

## 2021-04-27 MED ORDER — DEXAMETHASONE SODIUM PHOSPHATE 4 MG/ML IJ SOLN
INTRAMUSCULAR | 3 refills | Status: AC
  Filled 2021-04-27: qty 1, 1d supply, fill #0
  Filled 2022-04-07: qty 1, 1d supply, fill #1

## 2021-04-28 ENCOUNTER — Other Ambulatory Visit (HOSPITAL_COMMUNITY): Payer: Self-pay

## 2021-05-10 ENCOUNTER — Other Ambulatory Visit (HOSPITAL_COMMUNITY): Payer: Self-pay

## 2021-05-24 ENCOUNTER — Other Ambulatory Visit (HOSPITAL_COMMUNITY): Payer: Self-pay

## 2021-06-07 ENCOUNTER — Other Ambulatory Visit (HOSPITAL_COMMUNITY): Payer: Self-pay

## 2021-06-07 MED FILL — Hydrocortisone Tab 5 MG: ORAL | 23 days supply | Qty: 70 | Fill #0 | Status: AC

## 2021-06-07 MED FILL — Levetiracetam Tab 750 MG: ORAL | 90 days supply | Qty: 360 | Fill #2 | Status: AC

## 2021-06-11 ENCOUNTER — Other Ambulatory Visit (HOSPITAL_COMMUNITY): Payer: Self-pay

## 2021-06-28 ENCOUNTER — Other Ambulatory Visit (HOSPITAL_COMMUNITY): Payer: Self-pay

## 2021-07-12 ENCOUNTER — Other Ambulatory Visit (HOSPITAL_COMMUNITY): Payer: Self-pay

## 2021-08-04 ENCOUNTER — Other Ambulatory Visit (HOSPITAL_COMMUNITY): Payer: Self-pay

## 2021-08-05 ENCOUNTER — Other Ambulatory Visit (HOSPITAL_COMMUNITY): Payer: Self-pay

## 2021-08-05 MED ORDER — HYDROCORTISONE 5 MG PO TABS
5.0000 mg | ORAL_TABLET | Freq: Two times a day (BID) | ORAL | 3 refills | Status: DC
Start: 2021-08-05 — End: 2021-11-16
  Filled 2021-08-05 – 2021-08-19 (×2): qty 90, 30d supply, fill #0
  Filled 2021-09-06: qty 270, 90d supply, fill #0
  Filled 2021-11-11: qty 90, 30d supply, fill #1

## 2021-08-12 ENCOUNTER — Other Ambulatory Visit (HOSPITAL_COMMUNITY): Payer: Self-pay

## 2021-08-19 ENCOUNTER — Other Ambulatory Visit (HOSPITAL_COMMUNITY): Payer: Self-pay

## 2021-09-06 ENCOUNTER — Other Ambulatory Visit (HOSPITAL_COMMUNITY): Payer: Self-pay

## 2021-09-06 MED FILL — Levetiracetam Tab 750 MG: ORAL | 90 days supply | Qty: 360 | Fill #3 | Status: CN

## 2021-09-06 MED FILL — Levetiracetam Tab 750 MG: ORAL | 90 days supply | Qty: 360 | Fill #3 | Status: AC

## 2021-09-07 ENCOUNTER — Other Ambulatory Visit (HOSPITAL_COMMUNITY): Payer: Self-pay

## 2021-09-10 ENCOUNTER — Other Ambulatory Visit (HOSPITAL_COMMUNITY): Payer: Self-pay

## 2021-09-29 ENCOUNTER — Other Ambulatory Visit (HOSPITAL_COMMUNITY): Payer: Self-pay

## 2021-09-30 ENCOUNTER — Other Ambulatory Visit (HOSPITAL_COMMUNITY): Payer: Self-pay

## 2021-10-01 ENCOUNTER — Other Ambulatory Visit (HOSPITAL_COMMUNITY): Payer: Self-pay

## 2021-10-01 ENCOUNTER — Ambulatory Visit: Payer: 59 | Admitting: Neurology

## 2021-10-01 ENCOUNTER — Encounter: Payer: Self-pay | Admitting: Neurology

## 2021-10-01 ENCOUNTER — Other Ambulatory Visit: Payer: Self-pay

## 2021-10-01 VITALS — BP 115/78 | HR 88 | Ht 70.0 in | Wt 188.8 lb

## 2021-10-01 DIAGNOSIS — G40209 Localization-related (focal) (partial) symptomatic epilepsy and epileptic syndromes with complex partial seizures, not intractable, without status epilepticus: Secondary | ICD-10-CM

## 2021-10-01 MED ORDER — LEVETIRACETAM 750 MG PO TABS
1500.0000 mg | ORAL_TABLET | Freq: Two times a day (BID) | ORAL | 3 refills | Status: DC
  Filled 2021-10-01 – 2021-11-11 (×2): qty 360, 90d supply, fill #0
  Filled 2022-02-21 (×2): qty 360, 90d supply, fill #1
  Filled 2022-05-16: qty 360, 90d supply, fill #2
  Filled 2022-08-09 – 2022-08-23 (×3): qty 360, 90d supply, fill #3

## 2021-10-01 MED ORDER — LACOSAMIDE 200 MG PO TABS
200.0000 mg | ORAL_TABLET | Freq: Two times a day (BID) | ORAL | 3 refills | Status: DC
  Filled 2021-10-01 – 2021-11-15 (×3): qty 180, 90d supply, fill #0
  Filled 2022-02-10 (×2): qty 180, 90d supply, fill #1
  Filled 2022-05-16: qty 180, 90d supply, fill #2
  Filled 2022-08-09 – 2022-08-23 (×5): qty 180, 90d supply, fill #3
  Filled ????-??-??: fill #1

## 2021-10-01 NOTE — Progress Notes (Signed)
NEUROLOGY FOLLOW UP OFFICE NOTE  ANDRUW Torres [id number] 10/21/1990  HISTORY OF PRESENT ILLNESS: I had the pleasure of seeing Clayton Torres in follow-up in the neurology clinic on 10/01/2021.  The patient was last seen a year ago for seizures. He is again accompanied by his mother who helps supplement the history today.  Records and images were personally reviewed where available.  He continues to do well seizure-free since 2017 on Levetiracetam 750mg  2 tabs BID ([employer] manufacturer) and Lacosamide 200mg  BID ([employer] manufacturer), no side effects. They deny any staring/unresponsive episodes, gaps in time, olfactory/gustatory hallucinations, focal numbness/tingling/weakness, myoclonic jerks. No significant headaches, dizziness, vision changes, no falls. Sleep and mood are good. He continues to follow-up with [facility] for his shunt.   HPI: This is a pleasant 31 yo LH man with a history of congenital hydrocephalus s/p VP shunt at age 71 months and shunt revision in 2001, hypopituitarism, and seizures since 2006. The first seizure occurred in August 2006 in the setting of the patient not eating and being in a very hot environment. He hit the left side of his head and had a witnessed GTC. He has no recollection/warning that he could remember. He had an EEG at that time and was told they could "see where the spike was," and was started on Dilantin. He was switched by his neurologist in 2007 to Keppra a few months after. He went over a year without any seizures and medication taper was discussed. Within 2 weeks, he had a seizure in October 2007 out of sleep. Keppra was resumed and he did well for almost 7 years without any seizures until January 2014 when he had another GTC and Keppra dose was increased. He had several seizures that year, in March 2014, June 2014, and October 2014. He recalls feeling dizzy with the seizure in 05/2013, he fell in the tub. He was taking Keppra 1500mg  BID and Oxtellar  was added. He had hyponatremia with a sodium of 119 and Oxtellar was stopped. He was also tried on Aptiom which also caused hyponatremia. He was started on Vimpat in October 2014 and dose of 150mg  BID seemed to be the "magic combination" for him. His sodium levels stabilized between 135-138. Last Keppra level in March 2016 was 15. A Vimpat level in June 2015 was 29. He was again doing well with no seizures for almost 3 years until he had a witnessed convulsion on 10/15/15. He was at the computer writing a check, then his mother reported he did not answer her then turned red, followed by jerking and drooling for 5 minutes. No tongue bite or incontinence. After the seizure, he had sonorous respirations. He was also noted to break out in petechiae over his face. He was brought to [facility] ER where bloodwork where CBC and CMP were normal, Keppra level was low at 4.8. He had a head CT without contrast which showed the right-sided transparietal VP catheter terminating just left of the midline, stable to prior study, no hydrocephalus. He had been seeing neurologist Dr. April 2016 and Vimpat dose was increased to 200mg  BID. Most recent drug levels on 10/2015 showed a Keppra level of 13.7 (ref 10-40) and Vimpat level of 6.3 (ref 5-10).   They report that he got less sleep the night prior to the recent seizure, with only 4-1/2 hours of sleep. He denied any missed medication doses, no recent infections or alcohol intake. His parents deny any episodes of staring/unresponsiveness. He denies any gaps in time,  olfactory/gustatory hallucinations, deja vu, rising epigastric sensation, focal numbness/tingling/weakness, myoclonic jerks. He works at a [employer], he does not drive.    Epilepsy Risk Factors:  Patient was born premature at 7 months with congenital hydrocephalus s/p VP shunt. He has a history of panhypopituitarism. Otherwise no history of febrile convulsions, CNS infections such as meningitis/encephalitis,  significant traumatic brain injury, or family history of seizures.  Diagnostic Data: I personally reviewed MRI brain with and without contrast which did not show any acute changes. 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 There is a tiny developmental venous anomaly is noted in the anterior left frontal lobe. His 1-hour sleep-deprived EEG was abnormal with occasional focal slowing over the left posterior temporal region, occasional epileptiform discharges over the left posterior temporal region, a single sharp transient was seen over the right frontal region.     Prior AEDs: Dilantin, generic Keppra (side effects and impaired seizure control per prior records)  PAST MEDICAL HISTORY: Past Medical History:  Diagnosis Date   COVID-19    Hydrocephalus (HCC)    Seizure disorder (HCC)     MEDICATIONS: Current Outpatient Medications on File Prior to Visit  Medication Sig Dispense Refill   Aloe-Sodium Chloride (AYR SALINE NASAL GEL NA) Place 1 Dose into the nose daily.     ANDRODERM 4 MG/24HR PT24 patch Apply 4 mg topically daily.     Ascorbic Acid (VITAMIN C) 1000 MG tablet Take 1,000 mg by mouth daily.     Cholecalciferol (VITAMIN D-3 PO) Take 1,000 Units by mouth daily.     CORTEF 5 MG tablet Take 5 mg by mouth 2 (two) times daily. Will take 3 if increase stress     dexamethasone (DECADRON) 4 MG/ML injection Inject 4 mg intramusculary as needed for adrenal crisis 1 mL 3   Fexofenadine HCl (ALLEGRA PO) Take 180 mg by mouth daily. Every other day     fexofenadine-pseudoephedrine (ALLEGRA-D 24) 180-240 MG 24 hr tablet Take 1 tablet by mouth. Every other day     fluticasone (FLONASE) 50  MCG/ACT nasal spray Place into both nostrils as needed.     hydrocortisone (CORTEF) 5 MG tablet Take 1 tablet (5 mg total) by mouth 2 (two) times daily. May increase to 1 tablet three times daily on sick days or at work as directed 90 tablet 3   lacosamide (VIMPAT) 200 MG TABS tablet Take 1 tablet (200 mg total) by mouth 2 (two) times daily. 180 tablet 3   levETIRAcetam (KEPPRA) 750 MG tablet Take 2 tablets (1,500 mg total) by mouth 2 (two) times daily (2 tablets at 8 AM & 2 tablets at 8 PM). 360 tablet 3   Multiple Vitamins tablet Take 1 tablet by mouth daily.     SYNTHROID 112 MCG tablet Take 112 mcg by mouth daily.     SYNTHROID 112 MCG tablet TAKE 1 TABLET (112 MCG TOTAL) BY MOUTH ONCE DAILY TAKE ON AN EMPTY STOMACH WITH A GLASS OF WATER AT LEAST 30-60 MINUTES BEFORE BREAKFAST. 90 tablet 2   SYNTHROID 112 MCG tablet Take 1 tablet (112 mcg total) by mouth once daily Take on an empty stomach with a glass of water at least 30-60 minutes before breakfast.  90 tablet 4   testosterone (ANDRODERM) 4 MG/24HR PT24 patch PLACE 1 PATCH ONTO THE SKIN NIGHTLY 90 patch 0   testosterone (ANDRODERM) 4 MG/24HR PT24 patch Place 1 patch onto the skin nightly 90 patch 4   testosterone (ANDRODERM) 4 MG/24HR PT24 patch Place 1 patch onto the skin at bedtime. 30 patch 5   No current facility-administered medications on file prior to visit.    ALLERGIES: Allergies  Allergen Reactions   Benzonatate Diarrhea   Ceftriaxone Hives   Eslicarbazepine Other (See Comments)    Low soduim   Oxcarbazepine Other (See Comments)    Low soduim   Testosterone Rash    FAMILY HISTORY: Family History  Problem Relation Age of Onset   Hypertension Other    Diabetes Other    CAD Other    COPD Other    Cancer Paternal Grandmother    Cancer Paternal Grandfather     SOCIAL HISTORY: Social History   Socioeconomic History   Marital status: Single    Spouse name: Not on file   Number of children: Not on file   Years of  education: Not on file   Highest education level: Not on file  Occupational History   Occupation: Futures trader  Tobacco Use   Smoking status: Never   Smokeless tobacco: Never  Vaping Use   Vaping Use: Never used  Substance and Sexual Activity   Alcohol use: No    Alcohol/week: 0.0 standard drinks   Drug use: No   Sexual activity: Not on file  Other Topics Concern   Not on file  Social History Narrative   Lives w/ parents   Caffeine use: 1 cup coffee per day      PA's: [phone]      Left handed    Social Determinants of Health   Financial Resource Strain: Not on file  Food Insecurity: Not on file  Transportation Needs: Not on file  Physical Activity: Not on file  Stress: Not on file  Social Connections: Not on file  Intimate Partner Violence: Not on file     PHYSICAL EXAM: Vitals:   10/01/21 1450  BP: 115/78  Pulse: 88  SpO2: 96%   General: No acute distress Head:  Normocephalic/atraumatic Skin/Extremities: No rash, no edema Neurological Exam: alert and awake. No aphasia or dysarthria. Fund of knowledge is appropriate. Attention and concentration are normal.   Cranial nerves: Pupils equal, round. Extraocular movements intact with no nystagmus. Visual fields full.  No facial asymmetry.  Motor: Bulk and tone normal, muscle strength 5/5 throughout with no pronator drift.   Finger to nose testing intact.  Gait narrow-based and steady, able to tandem walk adequately.  Romberg negative.   IMPRESSION: This is a pleasant 31 yo LH man with a history of congenital hydrocephalus s/p VP shunt, panhypopituitarism, and focal to bilateral tonic-clonic epilepsy since 2006. All his seizures have been convulsive seizures. 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 He remains seizure-free since March 2017 on Levetiracetam 750mg  2 tabs  BID and Lacosamide 200mg  BID, refills sent. He does not drive. Follow-up in 1 year, call for any changes.    Thank you for allowing me to participate in his care.  Please do not hesitate to call for any questions or concerns.    , M.D.   CC: Dr. 

## 2021-10-01 NOTE — Patient Instructions (Signed)
Always good to see you. Continue Levetiracetam 750mg : take 2 tablets twice a day and Lacosamide 200mg  twice a day. Follow-up in 1 year, call for any changes.   Seizure Precautions: 1. If medication has been prescribed for you to prevent seizures, take it exactly as directed.  Do not stop taking the medicine without talking to your doctor first, even if you have not had a seizure in a long time.   2. Avoid activities in which a seizure would cause danger to yourself or to others.  Don't operate dangerous machinery, swim alone, or climb in high or dangerous places, such as on ladders, roofs, or girders.  Do not drive unless your doctor says you may.  3. If you have any warning that you may have a seizure, lay down in a safe place where you can't hurt yourself.    4.  No driving for 6 months from last seizure, as per [facility].   Please refer to the following link on the Epilepsy Foundation of America's website for more information: http://www.epilepsyfoundation.org/answerplace/Social/driving/drivingu.cfm   5.  Maintain good sleep hygiene. Avoid alcohol.  6.  Contact your doctor if you have any problems that may be related to the medicine you are taking.  7.  Call 911 and bring the patient back to the ED if:        A.  The seizure lasts longer than 5 minutes.       B.  The patient doesn't awaken shortly after the seizure  C.  The patient has new problems such as difficulty seeing, speaking or moving  D.  The patient was injured during the seizure  E.  The patient has a temperature over 102 F (39C)  F.  The patient vomited and now is having trouble breathing

## 2021-10-12 ENCOUNTER — Other Ambulatory Visit (HOSPITAL_COMMUNITY): Payer: Self-pay

## 2021-11-11 ENCOUNTER — Other Ambulatory Visit (HOSPITAL_COMMUNITY): Payer: Self-pay

## 2021-11-12 ENCOUNTER — Other Ambulatory Visit (HOSPITAL_COMMUNITY): Payer: Self-pay

## 2021-11-15 ENCOUNTER — Other Ambulatory Visit (HOSPITAL_COMMUNITY): Payer: Self-pay

## 2021-11-16 ENCOUNTER — Other Ambulatory Visit (HOSPITAL_COMMUNITY): Payer: Self-pay

## 2021-11-16 MED ORDER — ANDRODERM 4 MG/24HR TD PT24
1.0000 | MEDICATED_PATCH | Freq: Every evening | TRANSDERMAL | 5 refills | Status: DC
  Filled 2021-11-16: qty 30, 30d supply, fill #0
  Filled 2021-12-21: qty 30, 30d supply, fill #1

## 2021-11-16 MED ORDER — HYDROCORTISONE 5 MG PO TABS
5.0000 mg | ORAL_TABLET | Freq: Two times a day (BID) | ORAL | 3 refills | Status: DC
  Filled 2021-11-16: qty 90, 30d supply, fill #0
  Filled 2022-01-11: qty 90, 30d supply, fill #1
  Filled 2022-02-21: qty 90, 30d supply, fill #2
  Filled 2022-04-07: qty 90, 30d supply, fill #3

## 2021-11-17 ENCOUNTER — Other Ambulatory Visit (HOSPITAL_COMMUNITY): Payer: Self-pay

## 2021-12-16 DIAGNOSIS — E039 Hypothyroidism, unspecified: Secondary | ICD-10-CM | POA: Diagnosis not present

## 2021-12-16 DIAGNOSIS — E230 Hypopituitarism: Secondary | ICD-10-CM | POA: Diagnosis not present

## 2021-12-16 DIAGNOSIS — Z0001 Encounter for general adult medical examination with abnormal findings: Secondary | ICD-10-CM | POA: Diagnosis not present

## 2021-12-16 DIAGNOSIS — Z13220 Encounter for screening for lipoid disorders: Secondary | ICD-10-CM | POA: Diagnosis not present

## 2021-12-21 ENCOUNTER — Other Ambulatory Visit (HOSPITAL_COMMUNITY): Payer: Self-pay

## 2021-12-21 DIAGNOSIS — I10 Essential (primary) hypertension: Secondary | ICD-10-CM | POA: Diagnosis not present

## 2021-12-21 DIAGNOSIS — Z6825 Body mass index (BMI) 25.0-25.9, adult: Secondary | ICD-10-CM | POA: Diagnosis not present

## 2021-12-21 DIAGNOSIS — G40409 Other generalized epilepsy and epileptic syndromes, not intractable, without status epilepticus: Secondary | ICD-10-CM | POA: Diagnosis not present

## 2021-12-21 DIAGNOSIS — Z1331 Encounter for screening for depression: Secondary | ICD-10-CM | POA: Diagnosis not present

## 2021-12-21 DIAGNOSIS — Z0001 Encounter for general adult medical examination with abnormal findings: Secondary | ICD-10-CM | POA: Diagnosis not present

## 2021-12-21 DIAGNOSIS — E230 Hypopituitarism: Secondary | ICD-10-CM | POA: Diagnosis not present

## 2021-12-21 DIAGNOSIS — E039 Hypothyroidism, unspecified: Secondary | ICD-10-CM | POA: Diagnosis not present

## 2021-12-21 DIAGNOSIS — Z1389 Encounter for screening for other disorder: Secondary | ICD-10-CM | POA: Diagnosis not present

## 2021-12-23 ENCOUNTER — Other Ambulatory Visit (HOSPITAL_COMMUNITY): Payer: Self-pay

## 2021-12-23 MED ORDER — "NEEDLE (DISP) 18G X 1"" MISC"
4 refills | Status: AC
  Filled 2021-12-23: qty 12, 84d supply, fill #0
  Filled 2022-02-21: qty 12, 84d supply, fill #1
  Filled 2022-06-09: qty 12, 84d supply, fill #2
  Filled 2022-07-25 – 2022-08-09 (×3): qty 12, 84d supply, fill #3

## 2021-12-23 MED ORDER — BD TB SYRINGE 27G X 1/2" 1 ML MISC
4 refills | Status: DC
Start: 2021-12-23 — End: 2022-08-30
  Filled 2021-12-23: qty 12, 84d supply, fill #0
  Filled 2022-02-21: qty 12, 84d supply, fill #1

## 2021-12-23 MED ORDER — TESTOSTERONE CYPIONATE 200 MG/ML IM SOLN
50.0000 mg | INTRAMUSCULAR | 5 refills | Status: AC
  Filled 2021-12-23: qty 4, 28d supply, fill #0
  Filled 2022-02-21: qty 4, 28d supply, fill #1

## 2021-12-24 ENCOUNTER — Other Ambulatory Visit (HOSPITAL_COMMUNITY): Payer: Self-pay

## 2022-01-11 ENCOUNTER — Other Ambulatory Visit (HOSPITAL_COMMUNITY): Payer: Self-pay

## 2022-02-07 ENCOUNTER — Other Ambulatory Visit (HOSPITAL_COMMUNITY): Payer: Self-pay

## 2022-02-09 ENCOUNTER — Other Ambulatory Visit (HOSPITAL_COMMUNITY): Payer: Self-pay

## 2022-02-10 ENCOUNTER — Other Ambulatory Visit (HOSPITAL_COMMUNITY): Payer: Self-pay

## 2022-02-15 DIAGNOSIS — H47093 Other disorders of optic nerve, not elsewhere classified, bilateral: Secondary | ICD-10-CM | POA: Diagnosis not present

## 2022-02-15 DIAGNOSIS — H43823 Vitreomacular adhesion, bilateral: Secondary | ICD-10-CM | POA: Diagnosis not present

## 2022-02-21 ENCOUNTER — Other Ambulatory Visit (HOSPITAL_COMMUNITY): Payer: Self-pay

## 2022-02-22 ENCOUNTER — Other Ambulatory Visit (HOSPITAL_COMMUNITY): Payer: Self-pay

## 2022-02-23 ENCOUNTER — Other Ambulatory Visit (HOSPITAL_COMMUNITY): Payer: Self-pay

## 2022-02-25 ENCOUNTER — Other Ambulatory Visit (HOSPITAL_COMMUNITY): Payer: Self-pay

## 2022-03-23 DIAGNOSIS — J0101 Acute recurrent maxillary sinusitis: Secondary | ICD-10-CM | POA: Diagnosis not present

## 2022-03-23 DIAGNOSIS — Z20828 Contact with and (suspected) exposure to other viral communicable diseases: Secondary | ICD-10-CM | POA: Diagnosis not present

## 2022-03-23 DIAGNOSIS — Z6826 Body mass index (BMI) 26.0-26.9, adult: Secondary | ICD-10-CM | POA: Diagnosis not present

## 2022-03-23 DIAGNOSIS — R030 Elevated blood-pressure reading, without diagnosis of hypertension: Secondary | ICD-10-CM | POA: Diagnosis not present

## 2022-04-07 ENCOUNTER — Other Ambulatory Visit (HOSPITAL_COMMUNITY): Payer: Self-pay

## 2022-05-03 DIAGNOSIS — Z79899 Other long term (current) drug therapy: Secondary | ICD-10-CM | POA: Diagnosis not present

## 2022-05-03 DIAGNOSIS — E038 Other specified hypothyroidism: Secondary | ICD-10-CM | POA: Diagnosis not present

## 2022-05-03 DIAGNOSIS — E2749 Other adrenocortical insufficiency: Secondary | ICD-10-CM | POA: Diagnosis not present

## 2022-05-03 DIAGNOSIS — Q892 Congenital malformations of other endocrine glands: Secondary | ICD-10-CM | POA: Diagnosis not present

## 2022-05-03 DIAGNOSIS — E230 Hypopituitarism: Secondary | ICD-10-CM | POA: Diagnosis not present

## 2022-05-03 DIAGNOSIS — E559 Vitamin D deficiency, unspecified: Secondary | ICD-10-CM | POA: Diagnosis not present

## 2022-05-04 ENCOUNTER — Other Ambulatory Visit (HOSPITAL_COMMUNITY): Payer: Self-pay

## 2022-05-04 MED ORDER — SYNTHROID 112 MCG PO TABS
112.0000 ug | ORAL_TABLET | Freq: Every day | ORAL | 4 refills | Status: DC
  Filled 2022-05-04: qty 102, 90d supply, fill #0
  Filled 2022-05-16: qty 102, 89d supply, fill #0
  Filled 2022-07-08: qty 102, 90d supply, fill #0
  Filled 2022-08-09: qty 102, 90d supply, fill #1

## 2022-05-04 MED ORDER — DEXAMETHASONE SODIUM PHOSPHATE 4 MG/ML IJ SOLN
4.0000 mg | INTRAMUSCULAR | 3 refills | Status: AC | PRN
  Filled 2022-05-04: qty 1, 1d supply, fill #0

## 2022-05-04 MED ORDER — HYDROCORTISONE 5 MG PO TABS
5.0000 mg | ORAL_TABLET | Freq: Two times a day (BID) | ORAL | 3 refills | Status: AC
  Filled 2022-05-04: qty 90, 30d supply, fill #0
  Filled 2022-06-09: qty 90, 30d supply, fill #1
  Filled 2022-07-25: qty 90, 30d supply, fill #2
  Filled 2022-10-28: qty 90, 30d supply, fill #3
  Filled 2022-11-16: qty 90, 40d supply, fill #3

## 2022-05-04 MED ORDER — "BD TB SYRINGE 27G X 1/2"" 1 ML MISC"
4 refills | Status: AC
  Filled 2022-05-04: qty 12, 84d supply, fill #0
  Filled 2022-07-25 – 2022-08-09 (×2): qty 12, 84d supply, fill #1
  Filled 2022-11-16 – 2022-11-21 (×2): qty 12, 84d supply, fill #2
  Filled 2023-01-04 – 2023-01-24 (×3): qty 12, 84d supply, fill #3

## 2022-05-04 MED ORDER — TESTOSTERONE CYPIONATE 200 MG/ML IM SOLN
50.0000 mg | INTRAMUSCULAR | 5 refills | Status: DC
  Filled 2022-05-04: qty 4, 28d supply, fill #0
  Filled 2022-07-25: qty 4, 28d supply, fill #1
  Filled 2022-10-07: qty 4, 28d supply, fill #2

## 2022-05-16 ENCOUNTER — Other Ambulatory Visit (HOSPITAL_COMMUNITY): Payer: Self-pay

## 2022-05-17 ENCOUNTER — Other Ambulatory Visit (HOSPITAL_COMMUNITY): Payer: Self-pay

## 2022-06-01 ENCOUNTER — Other Ambulatory Visit (HOSPITAL_COMMUNITY): Payer: Self-pay

## 2022-06-09 ENCOUNTER — Other Ambulatory Visit (HOSPITAL_COMMUNITY): Payer: Self-pay

## 2022-06-14 DIAGNOSIS — R7401 Elevation of levels of liver transaminase levels: Secondary | ICD-10-CM | POA: Diagnosis not present

## 2022-06-14 DIAGNOSIS — Z79899 Other long term (current) drug therapy: Secondary | ICD-10-CM | POA: Diagnosis not present

## 2022-06-14 DIAGNOSIS — E230 Hypopituitarism: Secondary | ICD-10-CM | POA: Diagnosis not present

## 2022-06-22 ENCOUNTER — Other Ambulatory Visit (HOSPITAL_COMMUNITY): Payer: Self-pay | Admitting: Family Medicine

## 2022-06-22 DIAGNOSIS — R748 Abnormal levels of other serum enzymes: Secondary | ICD-10-CM

## 2022-07-01 ENCOUNTER — Ambulatory Visit (HOSPITAL_COMMUNITY)
Admission: RE | Admit: 2022-07-01 | Discharge: 2022-07-01 | Disposition: A | Discharge status: Home / Self Care | Payer: 59 | Source: Ambulatory Visit | Attending: Family Medicine | Admitting: Family Medicine

## 2022-07-01 DIAGNOSIS — R748 Abnormal levels of other serum enzymes: Secondary | ICD-10-CM | POA: Diagnosis not present

## 2022-07-01 DIAGNOSIS — R7989 Other specified abnormal findings of blood chemistry: Secondary | ICD-10-CM | POA: Diagnosis not present

## 2022-07-08 ENCOUNTER — Other Ambulatory Visit (HOSPITAL_COMMUNITY): Payer: Self-pay

## 2022-07-22 DIAGNOSIS — R748 Abnormal levels of other serum enzymes: Secondary | ICD-10-CM | POA: Diagnosis not present

## 2022-07-25 ENCOUNTER — Other Ambulatory Visit (HOSPITAL_COMMUNITY): Payer: Self-pay

## 2022-08-09 ENCOUNTER — Other Ambulatory Visit: Payer: Self-pay

## 2022-08-09 ENCOUNTER — Other Ambulatory Visit (HOSPITAL_COMMUNITY): Payer: Self-pay

## 2022-08-15 ENCOUNTER — Other Ambulatory Visit (HOSPITAL_COMMUNITY): Payer: Self-pay

## 2022-08-18 ENCOUNTER — Other Ambulatory Visit (HOSPITAL_COMMUNITY): Payer: Self-pay

## 2022-08-19 ENCOUNTER — Other Ambulatory Visit: Payer: Self-pay

## 2022-08-19 ENCOUNTER — Other Ambulatory Visit (HOSPITAL_COMMUNITY): Payer: Self-pay

## 2022-08-20 ENCOUNTER — Other Ambulatory Visit (HOSPITAL_COMMUNITY): Payer: Self-pay

## 2022-08-22 ENCOUNTER — Other Ambulatory Visit (HOSPITAL_COMMUNITY): Payer: Self-pay

## 2022-08-23 ENCOUNTER — Other Ambulatory Visit (HOSPITAL_COMMUNITY): Payer: Self-pay

## 2022-08-23 ENCOUNTER — Encounter: Payer: Self-pay | Admitting: Neurology

## 2022-08-23 ENCOUNTER — Ambulatory Visit: Payer: Commercial Managed Care - PPO | Admitting: Neurology

## 2022-08-23 DIAGNOSIS — G40209 Localization-related (focal) (partial) symptomatic epilepsy and epileptic syndromes with complex partial seizures, not intractable, without status epilepticus: Secondary | ICD-10-CM

## 2022-08-23 MED ORDER — LEVETIRACETAM 750 MG PO TABS
1500.0000 mg | ORAL_TABLET | Freq: Two times a day (BID) | ORAL | 3 refills | Status: DC
  Filled 2022-08-23 – 2022-11-28 (×2): qty 360, 90d supply, fill #0
  Filled 2023-02-27: qty 360, 90d supply, fill #1
  Filled 2023-06-05: qty 360, 90d supply, fill #2

## 2022-08-23 MED ORDER — LACOSAMIDE 200 MG PO TABS
200.0000 mg | ORAL_TABLET | Freq: Two times a day (BID) | ORAL | 3 refills | Status: DC
  Filled 2022-08-23 – 2022-11-15 (×2): qty 180, 90d supply, fill #0
  Filled 2023-01-24 – 2023-02-10 (×3): qty 180, 90d supply, fill #1
  Filled 2023-05-12 (×2): qty 180, 90d supply, fill #2
  Filled ????-??-??: fill #2

## 2022-08-23 NOTE — Patient Instructions (Signed)
Always a pleasure to see you. Continue all your medications. Follow-up in November 2024, call for any changes.    Seizure Precautions: 1. If medication has been prescribed for you to prevent seizures, take it exactly as directed.  Do not stop taking the medicine without talking to your doctor first, even if you have not had a seizure in a long time.   2. Avoid activities in which a seizure would cause danger to yourself or to others.  Don't operate dangerous machinery, swim alone, or climb in high or dangerous places, such as on ladders, roofs, or girders.  Do not drive unless your doctor says you may.  3. If you have any warning that you may have a seizure, lay down in a safe place where you can't hurt yourself.    4.  No driving for 6 months from last seizure, as per [facility].   Please refer to the following link on the [place] website for more information: http://www.epilepsyfoundation.org/answerplace/Social/driving/drivingu.cfm   5.  Maintain good sleep hygiene. Avoid alcohol.  6.  Contact your doctor if you have any problems that may be related to the medicine you are taking.  7.  Call 911 and bring the patient back to the ED if:        A.  The seizure lasts longer than 5 minutes.       B.  The patient doesn't awaken shortly after the seizure  C.  The patient has new problems such as difficulty seeing, speaking or moving  D.  The patient was injured during the seizure  E.  The patient has a temperature over 102 F (39C)  F.  The patient vomited and now is having trouble breathing

## 2022-08-23 NOTE — Progress Notes (Signed)
NEUROLOGY FOLLOW UP OFFICE NOTE  RAFIK Torres [id number] 1991-05-01  HISTORY OF PRESENT ILLNESS: I had the pleasure of seeing Clayton Torres in follow-up in the neurology clinic on 08/23/2022.  The patient was last seen a year ago for seizures. He is again accompanied by his parents who help supplement the history today. Records and images were personally reviewed where available.  Since his last visit, he continues to do well seizure-free since 2017 on Levetiracetam 750mg  2 tabs BID ([employer] manufacturer) and Lacosamide 200mg  BID ([employer] manufacturer) without side effects. He denies any staring/unresponsive episodes, olfactory/gustatory hallucinations, focal numbness/tingling/weakness, myoclonic jerks. No headaches, dizziness, vision changes, no falls. Sleep habits are better. Mood is good. His mother wakes him to give AM medications, he takes his own PM medications, no missed doses. When he had bloodwork at [facility], his ALT was noted to be 58 (ref 15-50). On review of prior records, ALT was 58 in 2021 however ref range was 17-63. He denies any nausea, abdominal pain. Abdominal ultrasound was normal. He is scheduled for follow-up head CT for shunt with his Neurosurgeon.   HPI: This is a pleasant [name] with a history of congenital hydrocephalus s/p VP shunt at age 34 months and shunt revision in 2001, hypopituitarism, and seizures since 2006. The first seizure occurred in August 2006 in the setting of the patient not eating and being in a very hot environment. He hit the left side of his head and had a witnessed GTC. He has no recollection/warning that he could remember. He had an EEG at that time and was told they could "see where the spike was," and was started on Dilantin. He was switched by his neurologist in 2007 to Keppra a few months after. He went over a year without any seizures and medication taper was discussed. Within 2 weeks, he had a seizure in October 2007 out of sleep. Keppra was  resumed and he did well for almost 7 years without any seizures until January 2014 when he had another GTC and Keppra dose was increased. He had several seizures that year, in March 2014, June 2014, and October 2014. He recalls feeling dizzy with the seizure in 05/2013, he fell in the tub. He was taking Keppra 1500mg  BID and Oxtellar was added. He had hyponatremia with a sodium of 119 and Oxtellar was stopped. He was also tried on Aptiom which also caused hyponatremia. He was started on Vimpat in October 2014 and dose of 150mg  BID seemed to be the "magic combination" for him. His sodium levels stabilized between 135-138. Last Keppra level in March 2016 was 15. A Vimpat level in June 2015 was 29. He was again doing well with no seizures for almost 3 years until he had a witnessed convulsion on 10/15/15. He was at the computer writing a check, then his mother reported he did not answer her then turned red, followed by jerking and drooling for 5 minutes. No tongue bite or incontinence. After the seizure, he had sonorous respirations. He was also noted to break out in petechiae over his face. He was brought to [facility] ER where bloodwork where CBC and CMP were normal, Keppra level was low at 4.8. He had a head CT without contrast which showed the right-sided transparietal VP catheter terminating just left of the midline, stable to prior study, no hydrocephalus. He had been seeing neurologist Dr. April 2016 and Vimpat dose was increased to 200mg  BID. Most recent drug levels on 10/2015 showed a  Keppra level of 13.7 (ref 10-40) and Vimpat level of 6.3 (ref 5-10).   They report that he got less sleep the night prior to the recent seizure, with only 4-1/2 hours of sleep. He denied any missed medication doses, no recent infections or alcohol intake. His parents deny any episodes of staring/unresponsiveness. He denies any gaps in time, olfactory/gustatory hallucinations, deja vu, rising epigastric sensation, focal  numbness/tingling/weakness, myoclonic jerks. He works at a [employer], he does not drive.    Epilepsy Risk Factors:  Patient was born premature at 7 months with congenital hydrocephalus s/p VP shunt. He has a history of panhypopituitarism. Otherwise no history of febrile convulsions, CNS infections such as meningitis/encephalitis, significant traumatic brain injury, or family history of seizures.  Diagnostic Data: I personally reviewed MRI brain with and without contrast which did not show any acute changes. 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 There is a tiny developmental venous anomaly is noted in the anterior left frontal lobe. His 1-hour sleep-deprived EEG was abnormal with occasional focal slowing over the left posterior temporal region, occasional epileptiform discharges over the left posterior temporal region, a single sharp transient was seen over the right frontal region.     Prior AEDs: Dilantin, generic Keppra (side effects and impaired seizure control per prior records)  PAST MEDICAL HISTORY: Past Medical History:  Diagnosis Date   COVID-19    Hydrocephalus (HCC)    Seizure disorder (HCC)     MEDICATIONS: Current Outpatient Medications on File Prior to Visit  Medication Sig Dispense Refill   Aloe-Sodium Chloride (AYR SALINE NASAL GEL NA) Place 1 Dose into the nose daily.     Ascorbic Acid (VITAMIN C) 1000 MG tablet Take 1,000 mg by mouth daily.     CORTEF 5 MG tablet Take 5 mg by mouth 2 (two) times daily. Will take 3 if increase stress     dexamethasone (DECADRON) 4 MG/ML injection Inject 4 mg intramusculary as needed for adrenal crisis 1 mL 3    dexamethasone (DECADRON) 4 MG/ML injection Inject 1 mL (4 mg total) into the muscle as needed for adrenal crisis 1 mL 3   Fexofenadine HCl (ALLEGRA PO) Take 180 mg by mouth daily. Every other day     fexofenadine-pseudoephedrine (ALLEGRA-D 24) 180-240 MG 24 hr tablet Take 1 tablet by mouth. Every other day     hydrocortisone (CORTEF) 5 MG tablet Take 1 tablet (5 mg total) by mouth 2 (two) times daily. May increase to 1 tablet three times daily on sick days or at work as directed 90 tablet 3   lacosamide (VIMPAT) 200 MG TABS tablet Take 1 tablet (200 mg total) by mouth 2 (two) times daily. 180 tablet 3   levETIRAcetam (KEPPRA) 750 MG tablet Take 2 tablets (1,500 mg total) by mouth 2 (two) times daily (2 tablets at 8 AM & 2 tablets at 8 PM). 360 tablet 3   Multiple Vitamins tablet Take 1 tablet by mouth daily.     NEEDLE, DISP, 18 G 18G X 1" MISC use 1 syringe once a week to draw up testosterone 12 each 4   SYNTHROID 112 MCG tablet Take 112 mcg by mouth daily.     testosterone cypionate (  DEPOTESTOSTERONE CYPIONATE) 200 MG/ML injection Inject 0.25 mLs (50 mg total) into the skin once a week. 4 mL 5   testosterone cypionate (DEPOTESTOSTERONE CYPIONATE) 200 MG/ML injection Inject 0.25 mLs (50 mg total) into the skin once a week. 4 mL 5   TUBERCULIN SYR 1CC/27GX1/2" (B-D TB SYRINGE 1CC/27GX1/2") 27G X 1/2" 1 ML MISC Use to administer testosterone into the skin once a week 12 each 4   TUBERCULIN SYR 1CC/27GX1/2" (B-D TB SYRINGE 1CC/27GX1/2") 27G X 1/2" 1 ML MISC Inject testosterone into the skin once a week 12 each 4   SYNTHROID 112 MCG tablet TAKE 1 TABLET (112 MCG TOTAL) BY MOUTH ONCE DAILY TAKE ON AN EMPTY STOMACH WITH A GLASS OF WATER AT LEAST 30-60 MINUTES BEFORE BREAKFAST. 90 tablet 2   SYNTHROID 112 MCG tablet Take 1 tablet (112 mcg total) by mouth daily for 6 days, Then 2 pills on Sunday 102 tablet 4   testosterone (ANDRODERM) 4 MG/24HR PT24 patch PLACE 1 PATCH ONTO THE SKIN NIGHTLY 90 patch 0   No  current facility-administered medications on file prior to visit.    ALLERGIES: Allergies  Allergen Reactions   Benzonatate Diarrhea   Ceftriaxone Hives   Eslicarbazepine Other (See Comments)    Low soduim   Oxcarbazepine Other (See Comments)    Low soduim   Testosterone Rash    Rash with topical products    FAMILY HISTORY: Family History  Problem Relation Age of Onset   Hypertension Other    Diabetes Other    CAD Other    COPD Other    Cancer Paternal Grandmother    Cancer Paternal Grandfather     SOCIAL HISTORY: Social History   Socioeconomic History   Marital status: Single    Spouse name: Not on file   Number of children: Not on file   Years of education: Not on file   Highest education level: Not on file  Occupational History   Occupation: IT trainer  Tobacco Use   Smoking status: Never   Smokeless tobacco: Never  Vaping Use   Vaping Use: Never used  Substance and Sexual Activity   Alcohol use: No    Alcohol/week: 0.0 standard drinks of alcohol   Drug use: No   Sexual activity: Not on file  Other Topics Concern   Not on file  Social History Narrative   Lives w/ parents   Caffeine use: 1 cup coffee per day      PA's: [phone]      Left handed    Social Determinants of Health   Financial Resource Strain: Not on file  Food Insecurity: Not on file  Transportation Needs: Not on file  Physical Activity: Not on file  Stress: Not on file  Social Connections: Not on file  Intimate Partner Violence: Not on file     PHYSICAL EXAM: Vitals:   08/23/22 1502  BP: 124/80  Pulse: 98  Resp: 20  SpO2: 96%   General: No acute distress Head:  Normocephalic/atraumatic Skin/Extremities: No rash, no edema Neurological Exam: alert and awake. No aphasia or dysarthria. Fund of knowledge is appropriate.  Attention and concentration are normal.   Cranial nerves: Pupils equal, round. Extraocular movements intact with no nystagmus. Visual fields  full.  No facial asymmetry.  Motor: Bulk and tone normal, muscle strength 5/5 throughout with no pronator drift.   Finger to nose testing intact.  Gait narrow-based and steady, able to tandem walk adequately.  Romberg negative.No tremors.  IMPRESSION: This is a pleasant [name] with a history of congenital hydrocephalus s/p VP shunt, panhypopituitarism, and focal to bilateral tonic-clonic epilepsy since 2006. All his seizures have been convulsive seizures. 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 He remains seizure-free since March 2017 on Levetiracetam 750mg  2 tabs BID and Lacosamide 200mg  BID, refills sent. He does not drive. Follow-up with PCP for minimally elevated ALT. Follow-up in November 2024, call for any changes.   Thank you for allowing me to participate in his care.  Please do not hesitate to call for any questions or concerns.   Ellouise Newer, M.D.   CC: Dr. Quillian Quince

## 2022-08-24 ENCOUNTER — Other Ambulatory Visit: Payer: Self-pay

## 2022-08-26 ENCOUNTER — Other Ambulatory Visit (HOSPITAL_COMMUNITY): Payer: Self-pay

## 2022-08-29 ENCOUNTER — Other Ambulatory Visit (HOSPITAL_COMMUNITY): Payer: Self-pay

## 2022-08-30 ENCOUNTER — Other Ambulatory Visit (HOSPITAL_COMMUNITY): Payer: Self-pay

## 2022-08-30 ENCOUNTER — Other Ambulatory Visit: Payer: Self-pay

## 2022-08-30 DIAGNOSIS — E038 Other specified hypothyroidism: Secondary | ICD-10-CM | POA: Diagnosis not present

## 2022-08-30 DIAGNOSIS — E559 Vitamin D deficiency, unspecified: Secondary | ICD-10-CM | POA: Diagnosis not present

## 2022-08-30 DIAGNOSIS — E2749 Other adrenocortical insufficiency: Secondary | ICD-10-CM | POA: Diagnosis not present

## 2022-08-30 DIAGNOSIS — E230 Hypopituitarism: Secondary | ICD-10-CM | POA: Diagnosis not present

## 2022-08-30 DIAGNOSIS — Q892 Congenital malformations of other endocrine glands: Secondary | ICD-10-CM | POA: Diagnosis not present

## 2022-08-30 DIAGNOSIS — Z79899 Other long term (current) drug therapy: Secondary | ICD-10-CM | POA: Diagnosis not present

## 2022-08-30 MED ORDER — "BD DISP NEEDLES 27G X 1/2"" MISC"
1.0000 | 0 refills | Status: DC
  Filled 2022-08-30: qty 12, 84d supply, fill #0

## 2022-08-30 MED ORDER — "BD LUER-LOK SYRINGE 18G X 1-1/2"" 3 ML MISC"
1.0000 | 2 refills | Status: DC
  Filled 2022-08-30: qty 12, 84d supply, fill #0

## 2022-08-30 MED ORDER — "HYPODERMIC NEEDLE 18G X 1"" MISC"
4 refills | Status: AC
  Filled 2022-08-30: qty 12, 12d supply, fill #0

## 2022-08-30 MED ORDER — HYDROCORTISONE 5 MG PO TABS
ORAL_TABLET | ORAL | 3 refills | Status: DC
  Filled 2022-08-30: qty 90, 45d supply, fill #0
  Filled 2023-01-04: qty 90, 45d supply, fill #1
  Filled 2023-02-06: qty 90, 30d supply, fill #2
  Filled 2023-02-13: qty 180, 60d supply, fill #2

## 2022-08-31 ENCOUNTER — Other Ambulatory Visit (HOSPITAL_COMMUNITY): Payer: Self-pay

## 2022-08-31 MED ORDER — SYNTHROID 112 MCG PO TABS
112.0000 ug | ORAL_TABLET | Freq: Every day | ORAL | 2 refills | Status: DC
  Filled 2022-08-31: qty 102, 102d supply, fill #0
  Filled 2022-10-07: qty 102, 90d supply, fill #0
  Filled 2022-10-07: qty 34, 30d supply, fill #0
  Filled 2023-01-04: qty 102, 90d supply, fill #1
  Filled 2023-03-31: qty 102, 90d supply, fill #2

## 2022-09-10 ENCOUNTER — Other Ambulatory Visit (HOSPITAL_COMMUNITY): Payer: Self-pay

## 2022-09-13 ENCOUNTER — Other Ambulatory Visit: Payer: Self-pay

## 2022-09-13 ENCOUNTER — Other Ambulatory Visit (HOSPITAL_COMMUNITY): Payer: Self-pay

## 2022-09-22 DIAGNOSIS — G919 Hydrocephalus, unspecified: Secondary | ICD-10-CM | POA: Diagnosis not present

## 2022-09-22 DIAGNOSIS — Z8669 Personal history of other diseases of the nervous system and sense organs: Secondary | ICD-10-CM | POA: Diagnosis not present

## 2022-09-22 DIAGNOSIS — Z982 Presence of cerebrospinal fluid drainage device: Secondary | ICD-10-CM | POA: Diagnosis not present

## 2022-10-07 ENCOUNTER — Other Ambulatory Visit (HOSPITAL_COMMUNITY): Payer: Self-pay

## 2022-10-10 ENCOUNTER — Other Ambulatory Visit (HOSPITAL_COMMUNITY): Payer: Self-pay

## 2022-10-28 ENCOUNTER — Other Ambulatory Visit (HOSPITAL_COMMUNITY): Payer: Self-pay

## 2022-10-29 ENCOUNTER — Other Ambulatory Visit (HOSPITAL_COMMUNITY): Payer: Self-pay

## 2022-11-03 ENCOUNTER — Other Ambulatory Visit (HOSPITAL_COMMUNITY): Payer: Self-pay

## 2022-11-11 ENCOUNTER — Other Ambulatory Visit (HOSPITAL_COMMUNITY): Payer: Self-pay

## 2022-11-15 ENCOUNTER — Other Ambulatory Visit: Payer: Self-pay

## 2022-11-15 ENCOUNTER — Other Ambulatory Visit (HOSPITAL_COMMUNITY): Payer: Self-pay

## 2022-11-16 ENCOUNTER — Other Ambulatory Visit (HOSPITAL_COMMUNITY): Payer: Self-pay

## 2022-11-17 ENCOUNTER — Other Ambulatory Visit: Payer: Self-pay

## 2022-11-21 ENCOUNTER — Other Ambulatory Visit (HOSPITAL_COMMUNITY): Payer: Self-pay

## 2022-11-28 ENCOUNTER — Other Ambulatory Visit (HOSPITAL_COMMUNITY): Payer: Self-pay

## 2022-11-29 ENCOUNTER — Other Ambulatory Visit (HOSPITAL_COMMUNITY): Payer: Self-pay

## 2022-12-14 ENCOUNTER — Other Ambulatory Visit (HOSPITAL_COMMUNITY): Payer: Self-pay

## 2023-01-04 ENCOUNTER — Other Ambulatory Visit (HOSPITAL_COMMUNITY): Payer: Self-pay

## 2023-01-04 MED ORDER — "INSULIN SYRINGE-NEEDLE U-100 27G X 1/2"" 1 ML MISC": 4 refills | Status: AC

## 2023-01-04 MED ORDER — "HYPODERMIC NEEDLE 18G X 1"" MISC"
4 refills | Status: AC
  Filled 2023-10-06: qty 12, 84d supply, fill #0

## 2023-01-04 MED ORDER — TESTOSTERONE CYPIONATE 200 MG/ML IM SOLN
50.0000 mg | INTRAMUSCULAR | 5 refills | Status: DC
  Filled 2023-01-10: qty 4, 28d supply, fill #0
  Filled 2023-02-27: qty 4, 28d supply, fill #1
  Filled 2023-04-17: qty 4, 28d supply, fill #2
  Filled 2023-06-05: qty 4, 28d supply, fill #3

## 2023-01-05 ENCOUNTER — Other Ambulatory Visit (HOSPITAL_COMMUNITY): Payer: Self-pay

## 2023-01-05 ENCOUNTER — Other Ambulatory Visit: Payer: Self-pay

## 2023-01-09 ENCOUNTER — Other Ambulatory Visit (HOSPITAL_COMMUNITY): Payer: Self-pay

## 2023-01-09 ENCOUNTER — Other Ambulatory Visit: Payer: Self-pay

## 2023-01-10 ENCOUNTER — Other Ambulatory Visit (HOSPITAL_COMMUNITY): Payer: Self-pay

## 2023-01-11 ENCOUNTER — Other Ambulatory Visit: Payer: Self-pay

## 2023-01-11 ENCOUNTER — Other Ambulatory Visit (HOSPITAL_COMMUNITY): Payer: Self-pay

## 2023-01-12 ENCOUNTER — Other Ambulatory Visit (HOSPITAL_COMMUNITY): Payer: Self-pay

## 2023-01-12 DIAGNOSIS — E039 Hypothyroidism, unspecified: Secondary | ICD-10-CM | POA: Diagnosis not present

## 2023-01-12 DIAGNOSIS — G40409 Other generalized epilepsy and epileptic syndromes, not intractable, without status epilepticus: Secondary | ICD-10-CM | POA: Diagnosis not present

## 2023-01-12 DIAGNOSIS — E230 Hypopituitarism: Secondary | ICD-10-CM | POA: Diagnosis not present

## 2023-01-12 DIAGNOSIS — Z0001 Encounter for general adult medical examination with abnormal findings: Secondary | ICD-10-CM | POA: Diagnosis not present

## 2023-01-12 DIAGNOSIS — Z13220 Encounter for screening for lipoid disorders: Secondary | ICD-10-CM | POA: Diagnosis not present

## 2023-01-16 ENCOUNTER — Other Ambulatory Visit (HOSPITAL_COMMUNITY): Payer: Self-pay

## 2023-01-17 ENCOUNTER — Other Ambulatory Visit (HOSPITAL_COMMUNITY): Payer: Self-pay

## 2023-01-23 ENCOUNTER — Other Ambulatory Visit (HOSPITAL_COMMUNITY): Payer: Self-pay

## 2023-01-24 ENCOUNTER — Other Ambulatory Visit (HOSPITAL_COMMUNITY): Payer: Self-pay

## 2023-01-27 ENCOUNTER — Other Ambulatory Visit (HOSPITAL_COMMUNITY): Payer: Self-pay

## 2023-02-06 ENCOUNTER — Other Ambulatory Visit (HOSPITAL_COMMUNITY): Payer: Self-pay

## 2023-02-10 ENCOUNTER — Other Ambulatory Visit (HOSPITAL_COMMUNITY): Payer: Self-pay

## 2023-02-13 ENCOUNTER — Other Ambulatory Visit (HOSPITAL_COMMUNITY): Payer: Self-pay

## 2023-02-14 ENCOUNTER — Other Ambulatory Visit (HOSPITAL_COMMUNITY): Payer: Self-pay

## 2023-02-27 ENCOUNTER — Other Ambulatory Visit (HOSPITAL_COMMUNITY): Payer: Self-pay

## 2023-02-28 ENCOUNTER — Other Ambulatory Visit (HOSPITAL_COMMUNITY): Payer: Self-pay

## 2023-03-31 ENCOUNTER — Other Ambulatory Visit (HOSPITAL_COMMUNITY): Payer: Self-pay

## 2023-03-31 MED ORDER — HYDROCORTISONE 5 MG PO TABS
ORAL_TABLET | ORAL | 3 refills | Status: DC
  Filled 2023-03-31: qty 90, 30d supply, fill #0

## 2023-04-02 ENCOUNTER — Other Ambulatory Visit: Payer: Self-pay

## 2023-04-03 ENCOUNTER — Other Ambulatory Visit (HOSPITAL_COMMUNITY): Payer: Self-pay

## 2023-04-04 ENCOUNTER — Other Ambulatory Visit (HOSPITAL_COMMUNITY): Payer: Self-pay

## 2023-04-04 MED ORDER — HYDROCORTISONE 5 MG PO TABS
5.0000 mg | ORAL_TABLET | Freq: Two times a day (BID) | ORAL | 3 refills | Status: DC
  Filled 2023-04-04: qty 180, 90d supply, fill #0

## 2023-04-07 ENCOUNTER — Other Ambulatory Visit (HOSPITAL_COMMUNITY): Payer: Self-pay

## 2023-04-17 ENCOUNTER — Other Ambulatory Visit (HOSPITAL_COMMUNITY): Payer: Self-pay

## 2023-05-08 ENCOUNTER — Other Ambulatory Visit (HOSPITAL_COMMUNITY): Payer: Self-pay

## 2023-05-09 ENCOUNTER — Other Ambulatory Visit (HOSPITAL_COMMUNITY): Payer: Self-pay

## 2023-05-09 ENCOUNTER — Other Ambulatory Visit: Payer: Self-pay

## 2023-05-09 MED ORDER — HYDROCORTISONE 5 MG PO TABS
ORAL_TABLET | ORAL | 3 refills | Status: DC
  Filled 2023-05-09: qty 180, 74d supply, fill #0
  Filled 2023-07-21: qty 204, 84d supply, fill #1
  Filled 2023-10-20: qty 204, 84d supply, fill #2

## 2023-05-09 MED ORDER — DEXAMETHASONE SODIUM PHOSPHATE 4 MG/ML IJ SOLN
4.0000 mg | INTRAMUSCULAR | 3 refills | Status: DC | PRN
  Filled 2023-05-09: qty 1, 1d supply, fill #0
  Filled 2023-10-06 (×2): qty 1, 1d supply, fill #1

## 2023-05-09 MED ORDER — SYNTHROID 112 MCG PO TABS
112.0000 ug | ORAL_TABLET | Freq: Every day | ORAL | 0 refills | Status: DC
  Filled 2023-05-09: qty 102, 95d supply, fill #0
  Filled 2023-07-10: qty 102, 90d supply, fill #0

## 2023-05-10 ENCOUNTER — Other Ambulatory Visit (HOSPITAL_COMMUNITY): Payer: Self-pay

## 2023-05-12 ENCOUNTER — Other Ambulatory Visit (HOSPITAL_COMMUNITY): Payer: Self-pay

## 2023-05-15 ENCOUNTER — Other Ambulatory Visit (HOSPITAL_COMMUNITY): Payer: Self-pay

## 2023-06-05 ENCOUNTER — Other Ambulatory Visit (HOSPITAL_COMMUNITY): Payer: Self-pay

## 2023-06-06 ENCOUNTER — Other Ambulatory Visit (HOSPITAL_COMMUNITY): Payer: Self-pay

## 2023-06-07 ENCOUNTER — Other Ambulatory Visit (HOSPITAL_COMMUNITY): Payer: Self-pay

## 2023-06-08 ENCOUNTER — Other Ambulatory Visit (HOSPITAL_COMMUNITY): Payer: Self-pay

## 2023-06-14 ENCOUNTER — Other Ambulatory Visit (HOSPITAL_COMMUNITY): Payer: Self-pay

## 2023-06-14 ENCOUNTER — Encounter: Payer: Self-pay | Admitting: Neurology

## 2023-06-14 ENCOUNTER — Ambulatory Visit: Payer: Commercial Managed Care - PPO | Admitting: Neurology

## 2023-06-14 DIAGNOSIS — G40209 Localization-related (focal) (partial) symptomatic epilepsy and epileptic syndromes with complex partial seizures, not intractable, without status epilepticus: Secondary | ICD-10-CM

## 2023-06-14 MED ORDER — LACOSAMIDE 200 MG PO TABS
200.0000 mg | ORAL_TABLET | Freq: Two times a day (BID) | ORAL | 3 refills | Status: DC
Start: 2023-06-14 — End: 2024-06-04
  Filled 2023-06-14 – 2023-08-11 (×2): qty 180, 90d supply, fill #0
  Filled 2023-11-07: qty 180, 90d supply, fill #1
  Filled 2024-02-06: qty 180, 90d supply, fill #2
  Filled 2024-05-02 – 2024-05-06 (×2): qty 180, 90d supply, fill #3

## 2023-06-14 MED ORDER — LEVETIRACETAM 750 MG PO TABS
1500.0000 mg | ORAL_TABLET | Freq: Two times a day (BID) | ORAL | 3 refills | Status: DC
Start: 2023-06-14 — End: 2024-06-04
  Filled 2023-06-14 – 2023-09-01 (×2): qty 360, 90d supply, fill #0
  Filled 2023-11-24: qty 360, 90d supply, fill #1
  Filled 2024-02-23: qty 360, 90d supply, fill #2
  Filled 2024-05-20: qty 360, 90d supply, fill #3

## 2023-06-14 NOTE — Patient Instructions (Signed)
Always a pleasure to see you. Continue all your medications. Follow-up in 1 year, call for any changes   Seizure Precautions: 1. If medication has been prescribed for you to prevent seizures, take it exactly as directed.  Do not stop taking the medicine without talking to your doctor first, even if you have not had a seizure in a long time.   2. Avoid activities in which a seizure would cause danger to yourself or to others.  Don't operate dangerous machinery, swim alone, or climb in high or dangerous places, such as on ladders, roofs, or girders.  Do not drive unless your doctor says you may.  3. If you have any warning that you may have a seizure, lay down in a safe place where you can't hurt yourself.    4.  No driving for 6 months from last seizure, as per [facility].   Please refer to the following link on the [employer]'s website for more information: http://www.epilepsyfoundation.org/answerplace/Social/driving/drivingu.cfm   5.  Maintain good sleep hygiene.  6.  Contact your doctor if you have any problems that may be related to the medicine you are taking.  7.  Call 911 and bring the patient back to the ED if:        A.  The seizure lasts longer than 5 minutes.       B.  The patient doesn't awaken shortly after the seizure  C.  The patient has new problems such as difficulty seeing, speaking or moving  D.  The patient was injured during the seizure  E.  The patient has a temperature over 102 F (39C)  F.  The patient vomited and now is having trouble breathing

## 2023-06-14 NOTE — Progress Notes (Signed)
NEUROLOGY FOLLOW UP OFFICE NOTE  Clayton POUPARD [id number] 1990-11-01  HISTORY OF PRESENT ILLNESS: I had the pleasure of seeing Clayton Torres in follow-up in the neurology clinic on 06/14/2023.  The patient was last seen 10 months ago for seizures. He is again accompanied by his father who helps supplement the history today.  Records and images were personally reviewed where available. He continues to do well seizure-free since 2017 on Levetiracetam 750mg  2 tabs BID ([employer] manufacturer) and Lacosamide 200mg  BID (Camber manufacturer), no side effects. He denies any staring/unresponsive episodes, gaps in time, olfactory/gustatory hallucinations, focal numbness/tingling/weakness, myoclonic jerks. No headaches, dizziness, vision changes, no falls. He gets 8 hours of sleep when working, 9-10 on days off. Mood is good.    HPI: This is a pleasant [name] with a history of congenital hydrocephalus s/p VP shunt at age 42 months and shunt revision in 2001, hypopituitarism, and seizures since 2006. The first seizure occurred in August 2006 in the setting of the patient not eating and being in a very hot environment. He hit the left side of his head and had a witnessed GTC. He has no recollection/warning that he could remember. He had an EEG at that time and was told they could "see where the spike was," and was started on Dilantin. He was switched by his neurologist in [place] to Keppra a few months after. He went over a year without any seizures and medication taper was discussed. Within 2 weeks, he had a seizure in October 2007 out of sleep. Keppra was resumed and he did well for almost 7 years without any seizures until January 2014 when he had another GTC and Keppra dose was increased. He had several seizures that year, in March 2014, June 2014, and October 2014. He recalls feeling dizzy with the seizure in 05/2013, he fell in the tub. He was taking Keppra 1500mg  BID and Oxtellar was added. He had  hyponatremia with a sodium of 119 and Oxtellar was stopped. He was also tried on Aptiom which also caused hyponatremia. He was started on Vimpat in October 2014 and dose of 150mg  BID seemed to be the "magic combination" for him. His sodium levels stabilized between 135-138. Last Keppra level in March 2016 was 15. A Vimpat level in June 2015 was 29. He was again doing well with no seizures for almost 3 years until he had a witnessed convulsion on 10/15/15. He was at the computer writing a check, then his mother reported he did not answer her then turned red, followed by jerking and drooling for 5 minutes. No tongue bite or incontinence. After the seizure, he had sonorous respirations. He was also noted to break out in petechiae over his face. He was brought to [facility] ER where bloodwork where CBC and CMP were normal, Keppra level was low at 4.8. He had a head CT without contrast which showed the right-sided transparietal VP catheter terminating just left of the midline, stable to prior study, no hydrocephalus. He had been seeing neurologist Dr. Lucia Gaskins and Vimpat dose was increased to 200mg  BID. Most recent drug levels on 10/2015 showed a Keppra level of 13.7 (ref 10-40) and Vimpat level of 6.3 (ref 5-10).   They report that he got less sleep the night prior to the recent seizure, with only 4-1/2 hours of sleep. He denied any missed medication doses, no recent infections or alcohol intake. His parents deny any episodes of staring/unresponsiveness. He denies any gaps in time, olfactory/gustatory  hallucinations, deja vu, rising epigastric sensation, focal numbness/tingling/weakness, myoclonic jerks. He works at a [employer], he does not drive.    Epilepsy Risk Factors:  Patient was born premature at 7 months with congenital hydrocephalus s/p VP shunt. He has a history of panhypopituitarism. Otherwise no history of febrile convulsions, CNS infections such as meningitis/encephalitis, significant traumatic brain  injury, or family history of seizures.  Diagnostic Data: I personally reviewed MRI brain with and without contrast which did not show any acute changes. 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 There is a tiny developmental venous anomaly is noted in the anterior left frontal lobe. His 1-hour sleep-deprived EEG was abnormal with occasional focal slowing over the left posterior temporal region, occasional epileptiform discharges over the left posterior temporal region, a single sharp transient was seen over the right frontal region.     Prior AEDs: Dilantin, generic Keppra (side effects and impaired seizure control per prior records)  PAST MEDICAL HISTORY: Past Medical History:  Diagnosis Date   COVID-19    Hydrocephalus (HCC)    Seizure disorder (HCC)     MEDICATIONS: Current Outpatient Medications on File Prior to Visit  Medication Sig Dispense Refill   Aloe-Sodium Chloride (AYR SALINE NASAL GEL NA) Place 1 Dose into the nose daily.     Ascorbic Acid (VITAMIN C) 1000 MG tablet Take 1,000 mg by mouth daily.     CORTEF 5 MG tablet Take 5 mg by mouth 2 (two) times daily. Will take 3 if increase stress     hydrocortisone (CORTEF) 5 MG tablet Take 1 tablet (5 mg total) by mouth 2 (two) times daily. May increase to 1 tablet three times daily on sick days or at work as directed 90 tablet 3   hydrocortisone (CORTEF) 5 MG tablet Take 1 tablet (5 mg total) by mouth 2 (two) times daily. May increase to 1 tablet by mouth 3 times daily on sick days or at work as directed. 180 tablet 3   Insulin Syringe-Needle U-100 27G X 1/2" 1 ML MISC To administer subcutaneous testosterone weekly 12 each 4    lacosamide (VIMPAT) 200 MG TABS tablet Take 1 tablet (200 mg total) by mouth 2 (two) times daily. Please dispense [phone]. 180 tablet 3   levETIRAcetam (KEPPRA) 750 MG tablet Take 2 tablets (1,500 mg total) by mouth 2 (two) times daily (2 tablets at 8 AM & 2 tablets at 8 PM). 360 tablet 3   Multiple Vitamins tablet Take 1 tablet by mouth daily.     SYNTHROID 112 MCG tablet Take 1 tablet (112 mcg total) by mouth daily 6 days of the week, take 1.5 tablets ( ) on Sundays. 102 tablet 0   testosterone cypionate (DEPOTESTOSTERONE CYPIONATE) 200 MG/ML injection Inject 0.25 mLs (50 mg total) into the skin once a week. 4 mL 5   testosterone cypionate (DEPOTESTOSTERONE CYPIONATE) 200 MG/ML injection Inject 0.25 mLs (50 mg total) into the skin once a week. 4 mL 5   TUBERCULIN SYR 1CC/27GX1/2" (B-D TB SYRINGE 1CC/27GX1/2") 27G X 1/2" 1 ML MISC Inject testosterone into the skin once a week. DO NOT FILL WITH SAFETY GLIDE NEEDLES. MUST HAVE NDC [id number]. 12 each 4   dexamethasone (DECADRON) 4 MG/ML  injection Inject 4 mg intramusculary as needed for adrenal crisis (Patient not taking: Reported on 06/14/2023) 1 mL 3   dexamethasone (DECADRON) 4 MG/ML injection Inject 1 mL (4 mg total) into the muscle as needed for adrenal crisis (Patient not taking: Reported on 06/14/2023) 1 mL 3   dexamethasone (DECADRON) 4 MG/ML injection Inject 1 mL (4 mg total) into the muscle as needed for adrenal crisis. 1 mL 3   Fexofenadine HCl (ALLEGRA PO) Take 180 mg by mouth daily. Every other day     fexofenadine-pseudoephedrine (ALLEGRA-D 24) 180-240 MG 24 hr tablet Take 1 tablet by mouth. Every other day     hydrocortisone (CORTEF) 5 MG tablet Take 1 tablet (5 mg total) by mouth 2 (two) times daily. May increase to 1 tablet three times daily on sick days or at work as directed 90 tablet 3   hydrocortisone (CORTEF) 5 MG tablet Take 1 tablet (5 mg total) by mouth 2 (two) times daily from Tuesday-Friday and 1 tablet (5 mg total) by  mouth 3 (three) times daily from Saturday-Monday. 180 tablet 3   Needle, Disp, (HYPODERMIC NEEDLE 18GX1") 18G X 1" MISC Use 1 each once a week To draw up testosterone 12 each 4   Needle, Disp, (HYPODERMIC NEEDLE 18GX1") 18G X 1" MISC Use 1 each once a week To draw up testosterone 12 each 4   NEEDLE, DISP, 18 G 18G X 1" MISC use 1 syringe once a week to draw up testosterone 12 each 4   SYNTHROID 112 MCG tablet Take 112 mcg by mouth daily.     SYNTHROID 112 MCG tablet TAKE 1 TABLET (112 MCG TOTAL) BY MOUTH ONCE DAILY TAKE ON AN EMPTY STOMACH WITH A GLASS OF WATER AT LEAST 30-60 MINUTES BEFORE BREAKFAST. 90 tablet 2   testosterone (ANDRODERM) 4 MG/24HR PT24 patch PLACE 1 PATCH ONTO THE SKIN NIGHTLY 90 patch 0   No current facility-administered medications on file prior to visit.    ALLERGIES: Allergies  Allergen Reactions   Benzonatate Diarrhea   Ceftriaxone Hives   Eslicarbazepine Other (See Comments)    Low soduim   Oxcarbazepine Other (See Comments)    Low soduim   Testosterone Rash    Rash with topical products    FAMILY HISTORY: Family History  Problem Relation Age of Onset   Hypertension Other    Diabetes Other    CAD Other    COPD Other    Cancer Paternal Grandmother    Cancer Paternal Grandfather     SOCIAL HISTORY: Social History   Socioeconomic History   Marital status: Single    Spouse name: Not on file   Number of children: Not on file   Years of education: Not on file   Highest education level: Not on file  Occupational History   Occupation: Futures trader  Tobacco Use   Smoking status: Never   Smokeless tobacco: Never  Vaping Use   Vaping status: Never Used  Substance and Sexual Activity   Alcohol use: No    Alcohol/week: 0.0 standard drinks of alcohol   Drug use: No   Sexual activity: Not on file  Other Topics Concern   Not on file  Social History Narrative   Lives w/ parents   Caffeine use: 1 cup coffee per day      PA's:  [phone]      Left handed    Social Determinants of Health   Financial Resource Strain: Low Risk  (05/04/2023)  Received from [facility]   Overall Financial Resource Strain (CARDIA)    Difficulty of Paying Living Expenses: Not hard at all  Food Insecurity: No Food Insecurity (05/04/2023)   Received from [facility]   Hunger Vital Sign    Worried About Running Out of Food in the Last Year: Never true    Ran Out of Food in the Last Year: Never true  Transportation Needs: No Transportation Needs (05/04/2023)   Received from [facility] - Transportation    In the past 12 months, has lack of transportation kept you from medical appointments or from getting medications?: No    Lack of Transportation (Non-Medical): No  Physical Activity: Sufficiently Active (04/28/2020)   Received from [facility], [facility]   Exercise Vital Sign    Days of Exercise per Week: 2 days    Minutes of Exercise per Session: 150+ min  Stress: No Stress Concern Present (04/28/2020)   Received from [facility], [facility]   [employer] [facility] - Occupational Stress Questionnaire    Feeling of Stress : Not at all  Social Connections: Not on file  Intimate Partner Violence: Not on file     PHYSICAL EXAM: Vitals:   06/14/23 1451  BP: 112/80  Pulse: 88  Resp: 20  SpO2: 96%   General: No acute distress Head:  Normocephalic/atraumatic Skin/Extremities: No rash, no edema Neurological Exam: alert and awake. No aphasia or dysarthria. Fund of knowledge is appropriate.  Attention and concentration are normal.   Cranial nerves: Pupils equal, round. Extraocular movements intact with no nystagmus. Visual fields full.  No facial asymmetry.  Motor: Bulk and tone normal, muscle strength 5/5 throughout with no pronator drift.   Finger to nose testing intact.   Gait narrow-based and steady, able to tandem walk adequately.  Romberg negative.   IMPRESSION: This is a pleasant [name] with a history of congenital hydrocephalus s/p VP shunt, panhypopituitarism, and focal to bilateral tonic-clonic epilepsy since 2006. All his seizures have been convulsive seizures. 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 He has been seizure-free since March 2017 on Levetiracetam 750mg  2 tabs BID ([employer] manufacturer) and Lacosamide 200mg  BID (Camber manufacturer), refills sent. He does not drive. Follow-up in 1 year, call for any changes.    Thank you for allowing me to participate in his care.  Please do not hesitate to call for any questions or concerns.    Patrcia Dolly, M.D.   CC: Dr. Reuel Boom

## 2023-07-10 ENCOUNTER — Other Ambulatory Visit (HOSPITAL_COMMUNITY): Payer: Self-pay

## 2023-07-21 ENCOUNTER — Other Ambulatory Visit (HOSPITAL_COMMUNITY): Payer: Self-pay

## 2023-07-31 ENCOUNTER — Other Ambulatory Visit (HOSPITAL_COMMUNITY): Payer: Self-pay

## 2023-08-11 ENCOUNTER — Other Ambulatory Visit (HOSPITAL_COMMUNITY): Payer: Self-pay

## 2023-09-01 ENCOUNTER — Other Ambulatory Visit (HOSPITAL_COMMUNITY): Payer: Self-pay

## 2023-09-04 ENCOUNTER — Other Ambulatory Visit (HOSPITAL_COMMUNITY): Payer: Self-pay

## 2023-09-05 ENCOUNTER — Other Ambulatory Visit (HOSPITAL_COMMUNITY): Payer: Self-pay

## 2023-09-06 ENCOUNTER — Other Ambulatory Visit (HOSPITAL_COMMUNITY): Payer: Self-pay

## 2023-10-02 ENCOUNTER — Other Ambulatory Visit (HOSPITAL_COMMUNITY): Payer: Self-pay

## 2023-10-03 ENCOUNTER — Other Ambulatory Visit (HOSPITAL_COMMUNITY): Payer: Self-pay

## 2023-10-03 MED ORDER — SYNTHROID 112 MCG PO TABS
ORAL_TABLET | ORAL | 4 refills | Status: AC
  Filled 2023-10-03: qty 102, 90d supply, fill #0
  Filled 2023-12-25: qty 102, 90d supply, fill #1
  Filled 2024-03-22: qty 102, 90d supply, fill #2
  Filled 2024-06-24: qty 102, 90d supply, fill #3
  Filled 2024-09-06 – 2024-09-08 (×2): qty 102, 90d supply, fill #0

## 2023-10-06 ENCOUNTER — Other Ambulatory Visit (HOSPITAL_COMMUNITY): Payer: Self-pay

## 2023-10-06 MED ORDER — TESTOSTERONE CYPIONATE 200 MG/ML IM SOLN
50.0000 mg | INTRAMUSCULAR | 5 refills | Status: DC
  Filled 2023-10-06: qty 4, 28d supply, fill #0
  Filled 2024-02-23: qty 4, 28d supply, fill #1

## 2023-10-09 ENCOUNTER — Other Ambulatory Visit (HOSPITAL_COMMUNITY): Payer: Self-pay

## 2023-10-20 ENCOUNTER — Other Ambulatory Visit: Payer: Self-pay | Admitting: Neurology

## 2023-10-20 ENCOUNTER — Other Ambulatory Visit (HOSPITAL_COMMUNITY): Payer: Self-pay

## 2023-10-20 DIAGNOSIS — G40209 Localization-related (focal) (partial) symptomatic epilepsy and epileptic syndromes with complex partial seizures, not intractable, without status epilepticus: Secondary | ICD-10-CM

## 2023-10-23 ENCOUNTER — Other Ambulatory Visit (HOSPITAL_COMMUNITY): Payer: Self-pay

## 2023-11-07 ENCOUNTER — Other Ambulatory Visit (HOSPITAL_COMMUNITY): Payer: Self-pay

## 2023-11-08 ENCOUNTER — Other Ambulatory Visit (HOSPITAL_COMMUNITY): Payer: Self-pay

## 2023-11-10 ENCOUNTER — Other Ambulatory Visit (HOSPITAL_COMMUNITY): Payer: Self-pay

## 2023-11-24 ENCOUNTER — Other Ambulatory Visit (HOSPITAL_COMMUNITY): Payer: Self-pay

## 2023-12-25 ENCOUNTER — Other Ambulatory Visit (HOSPITAL_COMMUNITY): Payer: Self-pay

## 2023-12-26 ENCOUNTER — Other Ambulatory Visit (HOSPITAL_COMMUNITY): Payer: Self-pay

## 2023-12-26 MED ORDER — HYDROCORTISONE 5 MG PO TABS
ORAL_TABLET | ORAL | 3 refills | Status: AC
  Filled 2023-12-26: qty 240, 99d supply, fill #0
  Filled 2023-12-29 – 2024-01-08 (×2): qty 204, 84d supply, fill #0
  Filled 2024-04-22 – 2024-05-06 (×2): qty 204, 84d supply, fill #1
  Filled 2024-07-12: qty 204, 84d supply, fill #2
  Filled ????-??-??: fill #3

## 2023-12-29 ENCOUNTER — Other Ambulatory Visit (HOSPITAL_COMMUNITY): Payer: Self-pay

## 2024-01-05 ENCOUNTER — Other Ambulatory Visit (HOSPITAL_COMMUNITY): Payer: Self-pay

## 2024-01-08 ENCOUNTER — Other Ambulatory Visit (HOSPITAL_COMMUNITY): Payer: Self-pay

## 2024-01-16 DIAGNOSIS — E039 Hypothyroidism, unspecified: Secondary | ICD-10-CM | POA: Diagnosis not present

## 2024-01-16 DIAGNOSIS — D559 Anemia due to enzyme disorder, unspecified: Secondary | ICD-10-CM | POA: Diagnosis not present

## 2024-01-16 DIAGNOSIS — Z13220 Encounter for screening for lipoid disorders: Secondary | ICD-10-CM | POA: Diagnosis not present

## 2024-01-16 DIAGNOSIS — R748 Abnormal levels of other serum enzymes: Secondary | ICD-10-CM | POA: Diagnosis not present

## 2024-01-16 DIAGNOSIS — E559 Vitamin D deficiency, unspecified: Secondary | ICD-10-CM | POA: Diagnosis not present

## 2024-01-22 ENCOUNTER — Other Ambulatory Visit (HOSPITAL_COMMUNITY): Payer: Self-pay

## 2024-01-26 ENCOUNTER — Other Ambulatory Visit (HOSPITAL_COMMUNITY): Payer: Self-pay

## 2024-02-06 ENCOUNTER — Other Ambulatory Visit (HOSPITAL_COMMUNITY): Payer: Self-pay

## 2024-02-06 HISTORY — PX: OTHER SURGICAL HISTORY: SHX169

## 2024-02-07 ENCOUNTER — Other Ambulatory Visit (HOSPITAL_COMMUNITY): Payer: Self-pay

## 2024-02-23 ENCOUNTER — Other Ambulatory Visit (HOSPITAL_COMMUNITY): Payer: Self-pay

## 2024-02-26 ENCOUNTER — Other Ambulatory Visit (HOSPITAL_COMMUNITY): Payer: Self-pay

## 2024-02-26 ENCOUNTER — Other Ambulatory Visit: Payer: Self-pay

## 2024-03-01 DIAGNOSIS — H5213 Myopia, bilateral: Secondary | ICD-10-CM | POA: Diagnosis not present

## 2024-03-02 ENCOUNTER — Emergency Department (HOSPITAL_COMMUNITY)
Admission: EM | Admit: 2024-03-02 | Discharge: 2024-03-03 | Disposition: A | Discharge status: Other Acute Inpatient Hospital

## 2024-03-02 ENCOUNTER — Emergency Department (HOSPITAL_COMMUNITY)

## 2024-03-02 ENCOUNTER — Encounter (HOSPITAL_COMMUNITY): Payer: Self-pay

## 2024-03-02 ENCOUNTER — Other Ambulatory Visit: Payer: Self-pay

## 2024-03-02 DIAGNOSIS — G919 Hydrocephalus, unspecified: Secondary | ICD-10-CM | POA: Insufficient documentation

## 2024-03-02 DIAGNOSIS — R569 Unspecified convulsions: Secondary | ICD-10-CM | POA: Diagnosis not present

## 2024-03-02 DIAGNOSIS — Y829 Unspecified medical devices associated with adverse incidents: Secondary | ICD-10-CM | POA: Diagnosis not present

## 2024-03-02 DIAGNOSIS — Z794 Long term (current) use of insulin: Secondary | ICD-10-CM | POA: Insufficient documentation

## 2024-03-02 DIAGNOSIS — Z4541 Encounter for adjustment and management of cerebrospinal fluid drainage device: Secondary | ICD-10-CM | POA: Diagnosis not present

## 2024-03-02 DIAGNOSIS — Z982 Presence of cerebrospinal fluid drainage device: Secondary | ICD-10-CM | POA: Diagnosis not present

## 2024-03-02 DIAGNOSIS — R519 Headache, unspecified: Secondary | ICD-10-CM | POA: Diagnosis not present

## 2024-03-02 DIAGNOSIS — T85618A Breakdown (mechanical) of other specified internal prosthetic devices, implants and grafts, initial encounter: Secondary | ICD-10-CM | POA: Diagnosis not present

## 2024-03-02 DIAGNOSIS — G40909 Epilepsy, unspecified, not intractable, without status epilepticus: Secondary | ICD-10-CM | POA: Insufficient documentation

## 2024-03-02 DIAGNOSIS — R112 Nausea with vomiting, unspecified: Secondary | ICD-10-CM | POA: Diagnosis not present

## 2024-03-02 DIAGNOSIS — R1111 Vomiting without nausea: Secondary | ICD-10-CM

## 2024-03-02 DIAGNOSIS — G9389 Other specified disorders of brain: Secondary | ICD-10-CM | POA: Diagnosis not present

## 2024-03-02 DIAGNOSIS — G40901 Epilepsy, unspecified, not intractable, with status epilepticus: Secondary | ICD-10-CM | POA: Diagnosis not present

## 2024-03-02 DIAGNOSIS — T8509XA Other mechanical complication of ventricular intracranial (communicating) shunt, initial encounter: Secondary | ICD-10-CM | POA: Insufficient documentation

## 2024-03-02 LAB — CBC WITH DIFFERENTIAL/PLATELET
Abs Immature Granulocytes: 0.02 K/uL (ref 0.00–0.07)
Basophils Absolute: 0.1 K/uL (ref 0.0–0.1)
Basophils Relative: 1 %
Eosinophils Absolute: 0.1 K/uL (ref 0.0–0.5)
Eosinophils Relative: 1 %
HCT: 46.5 % (ref 39.0–52.0)
Hemoglobin: 16.4 g/dL (ref 13.0–17.0)
Immature Granulocytes: 0 %
Lymphocytes Relative: 21 %
Lymphs Abs: 2 K/uL (ref 0.7–4.0)
MCH: 31.2 pg (ref 26.0–34.0)
MCHC: 35.3 g/dL (ref 30.0–36.0)
MCV: 88.6 fL (ref 80.0–100.0)
Monocytes Absolute: 0.9 K/uL (ref 0.1–1.0)
Monocytes Relative: 10 %
Neutro Abs: 6.2 K/uL (ref 1.7–7.7)
Neutrophils Relative %: 67 %
Platelets: 272 K/uL (ref 150–400)
RBC: 5.25 MIL/uL (ref 4.22–5.81)
RDW: 12.7 % (ref 11.5–15.5)
WBC: 9.2 K/uL (ref 4.0–10.5)
nRBC: 0 % (ref 0.0–0.2)

## 2024-03-02 LAB — BASIC METABOLIC PANEL WITH GFR
Anion gap: 14 (ref 5–15)
BUN: 13 mg/dL (ref 6–20)
CO2: 22 mmol/L (ref 22–32)
Calcium: 9.1 mg/dL (ref 8.9–10.3)
Chloride: 98 mmol/L (ref 98–111)
Creatinine, Ser: 0.64 mg/dL (ref 0.61–1.24)
GFR, Estimated: >60 mL/min (ref >60)
Glucose, Bld: 105 mg/dL — ABNORMAL HIGH (ref 70–99)
Potassium: 3.4 mmol/L — ABNORMAL LOW (ref 3.5–5.1)
Sodium: 134 mmol/L — ABNORMAL LOW (ref 135–145)

## 2024-03-02 LAB — LACTIC ACID, PLASMA
Lactic Acid, Venous: 1.9 mmol/L (ref 0.5–1.9)
Lactic Acid, Venous: 2.2 mmol/L (ref 0.5–1.9)

## 2024-03-02 MED ORDER — ONDANSETRON HCL 4 MG/2ML IJ SOLN
4.0000 mg | Freq: Once | INTRAMUSCULAR | Status: AC
  Administered 2024-03-02: 4 mg via INTRAVENOUS
  Filled 2024-03-02: qty 2

## 2024-03-02 MED ORDER — KETOROLAC TROMETHAMINE 15 MG/ML IJ SOLN
15.0000 mg | Freq: Once | INTRAMUSCULAR | Status: AC
  Administered 2024-03-02: 15 mg via INTRAVENOUS
  Filled 2024-03-02: qty 1

## 2024-03-02 MED ORDER — LEVETIRACETAM (KEPPRA) 500 MG/5 ML ADULT IV PUSH
2000.0000 mg | Freq: Once | INTRAVENOUS | Status: AC
  Administered 2024-03-02: 2000 mg via INTRAVENOUS
  Filled 2024-03-02: qty 20

## 2024-03-02 MED ORDER — LACTATED RINGERS IV BOLUS
500.0000 mL | Freq: Once | INTRAVENOUS | Status: AC
  Administered 2024-03-02: 500 mL via INTRAVENOUS

## 2024-03-02 MED ORDER — LORAZEPAM 2 MG/ML IJ SOLN
2.0000 mg | Freq: Once | INTRAMUSCULAR | Status: AC
  Administered 2024-03-02: 2 mg via INTRAVENOUS
  Filled 2024-03-02: qty 1

## 2024-03-02 NOTE — ED Notes (Signed)
 Approved for ED to ED transfer to [facility] [address], [place]  Accepting doctor: [place] Komisarow; Adult Neurosurgeon  Report can be called at [phone] when pt is leaving.   Power Risk manager

## 2024-03-02 NOTE — ED Triage Notes (Signed)
 Pt has VP Shunt and has been having a severe headache since this morning. Pt worked today and according to his father, seems disoriented and has stumbled a few times.

## 2024-03-02 NOTE — ED Notes (Signed)
 Per [facility], truck should arrive shortly for transport to [facility]

## 2024-03-02 NOTE — ED Provider Notes (Addendum)
 [place] EMERGENCY DEPARTMENT AT [facility] Provider Note   CSN: [id number] Arrival date & time: 03/02/24  2021     Patient presents with: Headache   Clayton Torres is a 33 y.o. male.   33 year old male with history of hydrocephalus as an infant presents for evaluation of headache.  He states that he had a headache off-and-on start today but seems to be getting worse.  States Tylenol  Motrin have not helped.  He is accompanied by his parents who help to provide history.  They state that the patient seemed unsteady on his feet at home and keep repeating himself and seems confused.  Patient does also have a history of seizures for which she takes Keppra  and Vimpat  has not missed any medications.  Last shunt revision was in 2011 and last follow-up with neurosurgery was 2024.  Patient denies any vision changes, nausea or vomiting, or any other symptoms or concerns at this time.   Headache Associated symptoms: no abdominal pain, no back pain, no cough, no ear pain, no eye pain, no fever, no seizures, no sore throat and no vomiting        Prior to Admission medications   Medication Sig Start Date End Date Taking? Authorizing Provider  Aloe-Sodium Chloride (AYR SALINE NASAL GEL NA) Place 1 Dose into the nose daily.    [provider]  Ascorbic Acid (VITAMIN C) 1000 MG tablet Take 1,000 mg by mouth daily.    [provider]  CORTEF  5 MG tablet Take 5 mg by mouth 2 (two) times daily. Will take 3 if increase stress 09/09/15   [provider]  dexamethasone  (DECADRON ) 4 MG/ML injection Inject 4 mg intramusculary as needed for adrenal crisis Patient not taking: Reported on 06/14/2023 04/27/21     dexamethasone  (DECADRON ) 4 MG/ML injection Inject 1 mL (4 mg total) into the muscle as needed for adrenal crisis Patient not taking: Reported on 06/14/2023 05/03/22     dexamethasone  (DECADRON ) 4 MG/ML injection Inject 1 mL (4 mg total) into the muscle as needed for adrenal  crisis. 05/09/23     Fexofenadine HCl (ALLEGRA PO) Take 180 mg by mouth daily. Every other day    [provider]  fexofenadine-pseudoephedrine (ALLEGRA-D 24) 180-240 MG 24 hr tablet Take 1 tablet by mouth. Every other day    [provider]  hydrocortisone  (CORTEF ) 5 MG tablet Take 1 tablet (5 mg total) by mouth 2 (two) times daily. May increase to 1 tablet three times daily on sick days or at work as directed 05/03/22     hydrocortisone  (CORTEF ) 5 MG tablet Take 1 tablet (5 mg total) by mouth 2 (two) times daily from Tuesday-Friday and 1 tablet (5 mg total) by mouth 3 (three) times daily from Saturday-Monday. 12/26/23     Insulin  Syringe-Needle U-100 27G X 1/2 1 ML MISC To administer subcutaneous testosterone  weekly 01/04/23     lacosamide  (VIMPAT ) 200 MG TABS tablet Take 1 tablet (200 mg total) by mouth 2 (two) times daily. Please dispense 31722-815-60. 06/14/23   Georjean Darice HERO, MD  levETIRAcetam  (KEPPRA ) 750 MG tablet Take 2 tablets (1,500 mg total) by mouth 2 (two) times daily (2 tablets at 8 AM & 2 tablets at 8 PM). 06/14/23   Georjean Darice HERO, MD  Multiple Vitamins tablet Take 1 tablet by mouth daily.    [provider]  Needle, Disp, (HYPODERMIC NEEDLE 18GX1) 18G X 1 MISC Use 1 each once a week To draw up  testosterone  08/30/22     Needle, Disp, (HYPODERMIC NEEDLE 18GX1) 18G X 1 MISC Use 1 each once a week To draw up testosterone  01/04/23     NEEDLE, DISP, 18 G 18G X 1 MISC use 1 syringe once a week to draw up testosterone  12/23/21     SYNTHROID  112 MCG tablet Take 112 mcg by mouth daily. 09/09/15   [provider]  SYNTHROID  112 MCG tablet TAKE 1 TABLET (112 MCG TOTAL) BY MOUTH ONCE DAILY TAKE ON AN EMPTY STOMACH WITH A GLASS OF WATER AT LEAST 30-60 MINUTES BEFORE BREAKFAST. 07/29/20 07/29/21  Rhinehart, Allyson Julia, PA-C  SYNTHROID  112 MCG tablet Take 1 tablet (112 mcg total) by mouth daily Monday-Saturday, then 1.5 tablets (168 mcg total) daily on Sundays.  10/03/23     testosterone  (ANDRODERM ) 4 MG/24HR PT24 patch PLACE 1 PATCH ONTO THE SKIN NIGHTLY 07/29/20 01/25/21  Rhinehart, Allyson Julia, PA-C  testosterone  cypionate (DEPOTESTOSTERONE CYPIONATE) 200 MG/ML injection Inject 0.25 mLs (50 mg total) into the skin once a week. 12/23/21     testosterone  cypionate (DEPOTESTOSTERONE CYPIONATE) 200 MG/ML injection Inject 0.25 mLs (50 mg total) into the skin once a week. 10/06/23     TUBERCULIN SYR 1CC/27GX1/2 (B-D TB SYRINGE 1CC/27GX1/2) 27G X 1/2 1 ML MISC Inject testosterone  into the skin once a week. DO NOT FILL WITH SAFETY GLIDE NEEDLES. MUST HAVE NDC [id number]. 05/03/22       Allergies: Benzonatate, Ceftriaxone, Eslicarbazepine, Oxcarbazepine, and Testosterone     Review of Systems  Constitutional:  Negative for chills and fever.  HENT:  Negative for ear pain and sore throat.   Eyes:  Negative for pain and visual disturbance.  Respiratory:  Negative for cough and shortness of breath.   Cardiovascular:  Negative for chest pain and palpitations.  Gastrointestinal:  Negative for abdominal pain and vomiting.  Genitourinary:  Negative for dysuria and hematuria.  Musculoskeletal:  Negative for arthralgias and back pain.  Skin:  Negative for color change and rash.  Neurological:  Positive for headaches. Negative for seizures and syncope.  All other systems reviewed and are negative.   Updated Vital Signs BP (!) 140/85   Pulse 98   Temp (!) 97.5 F (36.4 C) (Oral)   Resp 16   Ht 5' 10 (1.778 m)   Wt 85.7 kg   SpO2 95%   BMI 27.12 kg/m   Physical Exam Vitals and nursing note reviewed.  Constitutional:      General: He is not in acute distress.    Appearance: He is well-developed.  HENT:     Head: Normocephalic and atraumatic.  Eyes:     Conjunctiva/sclera: Conjunctivae normal.  Cardiovascular:     Rate and Rhythm: Normal rate and regular rhythm.     Heart sounds: No murmur heard. Pulmonary:     Effort: Pulmonary effort is normal.  No respiratory distress.     Breath sounds: Normal breath sounds.  Abdominal:     Palpations: Abdomen is soft.     Tenderness: There is no abdominal tenderness.  Musculoskeletal:        General: No swelling.     Cervical back: Neck supple.  Skin:    General: Skin is warm and dry.     Capillary Refill: Capillary refill takes less than 2 seconds.  Neurological:     Mental Status: He is alert and oriented to person, place, and time. Mental status is at baseline.     Cranial Nerves: No cranial nerve deficit.  Sensory: No sensory deficit.     Comments: Muscle strength is 5 out of 5 throughout, there is no facial droop or slurred speech  Psychiatric:        Mood and Affect: Mood normal.     (all labs ordered are listed, but only abnormal results are displayed) Labs Reviewed  BASIC METABOLIC PANEL WITH GFR - Abnormal; Notable for the following components:      Result Value   Sodium 134 (*)    Potassium 3.4 (*)    Glucose, Bld 105 (*)    All other components within normal limits  LACTIC ACID, PLASMA - Abnormal; Notable for the following components:   Lactic Acid, Venous 2.2 (*)    All other components within normal limits  CBC WITH DIFFERENTIAL/PLATELET  LACTIC ACID, PLASMA  LEVETIRACETAM  LEVEL  LACOSAMIDE     EKG: None  Radiology: DG Cervical Spine 1 View Result Date: 03/02/2024 CLINICAL DATA:  Shunt malfunction EXAM: DG CERVICAL SPINE - 1 VIEW; SKULL - 1-3 VIEW; CHEST 1 VIEW; ABDOMEN - 1 VIEW COMPARISON:  CT 03/02/2024 FINDINGS: Right occipital ventriculoperitoneal shunt is in place with its tip in the expected location of the [place]amina [place]. The extracranial shunt catheter tubing within the cervical soft tissues is surrounded by extensive dystrophic calcification, but appears intact along its course. The tubing is partially obscured by underpenetration within the thorax, however, its visualized portion superior to the cardiac silhouette appears intact. Within the abdomen,  shunt catheter tubing appears intact appears coiled within the hypogastric region. Lungs are clear. Cardiac size within normal limits. Pulmonary vascularity is normal. Normal abdominal gas pattern. IMPRESSION: 1. Right occipital ventriculoperitoneal shunt catheter tubing appears intact, though visualization is limited within the inferior thorax due to underpenetration. Electronically Signed   By: Dorethia Molt M.D.   On: 03/02/2024 22:21   DG Skull 1-3 Views Result Date: 03/02/2024 CLINICAL DATA:  Shunt malfunction EXAM: DG CERVICAL SPINE - 1 VIEW; SKULL - 1-3 VIEW; CHEST 1 VIEW; ABDOMEN - 1 VIEW COMPARISON:  CT 03/02/2024 FINDINGS: Right occipital ventriculoperitoneal shunt is in place with its tip in the expected location of the foramina [place]. The extracranial shunt catheter tubing within the cervical soft tissues is surrounded by extensive dystrophic calcification, but appears intact along its course. The tubing is partially obscured by underpenetration within the thorax, however, its visualized portion superior to the cardiac silhouette appears intact. Within the abdomen, shunt catheter tubing appears intact appears coiled within the hypogastric region. Lungs are clear. Cardiac size within normal limits. Pulmonary vascularity is normal. Normal abdominal gas pattern. IMPRESSION: 1. Right occipital ventriculoperitoneal shunt catheter tubing appears intact, though visualization is limited within the inferior thorax due to underpenetration. Electronically Signed   By: Dorethia Molt M.D.   On: 03/02/2024 22:21   DG Chest 1 View Result Date: 03/02/2024 CLINICAL DATA:  Shunt malfunction EXAM: DG CERVICAL SPINE - 1 VIEW; SKULL - 1-3 VIEW; CHEST 1 VIEW; ABDOMEN - 1 VIEW COMPARISON:  CT 03/02/2024 FINDINGS: Right occipital ventriculoperitoneal shunt is in place with its tip in the expected location of the foramina [place]. The extracranial shunt catheter tubing within the cervical soft tissues is surrounded by  extensive dystrophic calcification, but appears intact along its course. The tubing is partially obscured by underpenetration within the thorax, however, its visualized portion superior to the cardiac silhouette appears intact. Within the abdomen, shunt catheter tubing appears intact appears coiled within the hypogastric region. Lungs are clear. Cardiac size within normal limits. Pulmonary vascularity is  normal. Normal abdominal gas pattern. IMPRESSION: 1. Right occipital ventriculoperitoneal shunt catheter tubing appears intact, though visualization is limited within the inferior thorax due to underpenetration. Electronically Signed   By: Dorethia Molt M.D.   On: 03/02/2024 22:21   DG Abd 1 View Result Date: 03/02/2024 CLINICAL DATA:  Shunt malfunction EXAM: DG CERVICAL SPINE - 1 VIEW; SKULL - 1-3 VIEW; CHEST 1 VIEW; ABDOMEN - 1 VIEW COMPARISON:  CT 03/02/2024 FINDINGS: Right occipital ventriculoperitoneal shunt is in place with its tip in the expected location of the foramina [place]. The extracranial shunt catheter tubing within the cervical soft tissues is surrounded by extensive dystrophic calcification, but appears intact along its course. The tubing is partially obscured by underpenetration within the thorax, however, its visualized portion superior to the cardiac silhouette appears intact. Within the abdomen, shunt catheter tubing appears intact appears coiled within the hypogastric region. Lungs are clear. Cardiac size within normal limits. Pulmonary vascularity is normal. Normal abdominal gas pattern. IMPRESSION: 1. Right occipital ventriculoperitoneal shunt catheter tubing appears intact, though visualization is limited within the inferior thorax due to underpenetration. Electronically Signed   By: Dorethia Molt M.D.   On: 03/02/2024 22:21   CT Head Wo Contrast Result Date: 03/02/2024 CLINICAL DATA:  History of ventricular peritoneal shunt with increasing headaches EXAM: CT HEAD WITHOUT CONTRAST  TECHNIQUE: Contiguous axial images were obtained from the base of the skull through the vertex without intravenous contrast. RADIATION DOSE REDUCTION: This exam was performed according to the departmental dose-optimization program which includes automated exposure control, adjustment of the mA and/or kV according to patient size and/or use of iterative reconstruction technique. COMPARISON:  MRI from 12/04/2015 FINDINGS: Brain: Significant dilatation of the lateral and third ventricles is seen. Ventricular peritoneal shunt is again identified and stable in position. Blunting of the sulcal markings is seen related to the ventricular dilatation. Fourth ventricle appears to be within normal limits. No acute hemorrhage or infarct is seen. Vascular: No hyperdense vessel or unexpected calcification. Skull: Normal. Negative for fracture or focal lesion. Sinuses/Orbits: No acute finding. Other: None. IMPRESSION: Significant dilatation of the lateral and third ventricles despite VP shunt in place. This may indicate shunt dysfunction. Electronically Signed   By: Oneil Devonshire M.D.   On: 03/02/2024 21:14     Procedures   Medications Ordered in the ED  ketorolac  (TORADOL ) 15 MG/ML injection 15 mg (15 mg Intravenous Given 03/02/24 2049)  LORazepam  (ATIVAN ) injection 2 mg (2 mg Intravenous Given 03/02/24 2112)  ondansetron  (ZOFRAN ) injection 4 mg (4 mg Intravenous Given 03/02/24 2111)  lactated ringers  bolus 500 mL (500 mLs Intravenous New Bag/Given 03/02/24 2132)  levETIRAcetam  (KEPPRA ) undiluted injection 2,000 mg (2,000 mg Intravenous Given 03/02/24 2140)                                    Medical Decision Making Patient here for headache and concerns that his VP shunt may be malfunctioning.  Per family bedside will provide history he has been confused as he got home from work today.  Also stumbled once and almost fell and been repeating himself.  On initial exam he appears well was given Toradol  for headache and CT  scan showed enlarging of the ventricles concerning for shunt malfunction worsening hydrocephalus.  While in the CT scanner patient had what staff thought might be a seizure.  They state that he was gazing to one side and not responding  to them and did not remember the episode when he woke up.  Also started vomiting after.  He was given Ativan  Zofran  I did go ahead and load him with Keppra .  Discussed patient's case with neurosurgery at [facility] is that where patient follows up for his neurosurgery and family was requesting that he be transferred there.  We will set up transfer and transferred him to the emergency department.  Patient and family agreeable with plan for transfer.  After patient received Zofran  and Ativan  he was feeling much improved and had no further seizures.  Problems Addressed: Hydrocephalus, unspecified type [facility]): acute illness or injury that poses a threat to life or bodily functions Nonintractable headache, unspecified chronicity pattern, unspecified headache type: undiagnosed new problem with uncertain prognosis Seizure (HCC): undiagnosed new problem with uncertain prognosis Shunt malfunction, initial encounter: undiagnosed new problem with uncertain prognosis Vomiting without nausea, unspecified vomiting type: acute illness or injury  Amount and/or Complexity of Data Reviewed Independent Historian: parent    Details: Parents at bedside and helped provide history.  State patient stumbled and almost fell at home has seemed unsteady on his feet and been complaining of headache today.  They also state that he has been repeating himself very forgetful this evening External Data Reviewed: notes.    Details: Patient last followed up at [facility] in 2020 for for routine visit for his history of hydrocephalus Labs: ordered. Decision-making details documented in ED Course.    Details: Ordered and reviewed by me and he does have a slight elevated lactic acid and leukocytosis but  otherwise no abnormality Radiology: ordered and independent interpretation performed. Decision-making details documented in ED Course.    Details: Ordered and imaging was reviewed by me and patient does have increase in his size of his ventricles concerning for VP shunt malfunction and worsening hydrocephalus Discussion of management or test interpretation with external provider(s): Patient discussed with Dr. Warrick at [facility], neurosurgery and they accept patient for transfer to the ER for further workup and management.   Risk OTC drugs. Prescription drug management. Parenteral controlled substances. Drug therapy requiring intensive monitoring for toxicity. Decision regarding hospitalization. Risk Details: CRITICAL CARE Performed by: Duwaine LITTIE Fusi   Total critical care time: 45 minutes  Critical care time was exclusive of separately billable procedures and treating other patients.  Critical care was necessary to treat or prevent imminent or life-threatening deterioration.  Critical care was time spent personally by me on the following activities: development of treatment plan with patient and/or surrogate as well as nursing, discussions with consultants, evaluation of patient's response to treatment, examination of patient, obtaining history from patient or surrogate, ordering and performing treatments and interventions, ordering and review of laboratory studies, ordering and review of radiographic studies, pulse oximetry and re-evaluation of patient's condition.   Critical Care Total time providing critical care: 45 minutes    Final diagnoses:  Shunt malfunction, initial encounter  Seizure (HCC)  Vomiting without nausea, unspecified vomiting type  Nonintractable headache, unspecified chronicity pattern, unspecified headache type  Hydrocephalus, unspecified type [facility])    ED Discharge Orders     None          Fusi Duwaine LITTIE, DO 03/02/24 2336    Shekina Cordell  L, DO 03/02/24 2342

## 2024-03-03 DIAGNOSIS — R569 Unspecified convulsions: Secondary | ICD-10-CM | POA: Diagnosis not present

## 2024-03-03 DIAGNOSIS — T8509XA Other mechanical complication of ventricular intracranial (communicating) shunt, initial encounter: Secondary | ICD-10-CM | POA: Diagnosis not present

## 2024-03-03 DIAGNOSIS — T85618D Breakdown (mechanical) of other specified internal prosthetic devices, implants and grafts, subsequent encounter: Secondary | ICD-10-CM | POA: Diagnosis not present

## 2024-03-03 DIAGNOSIS — T85618A Breakdown (mechanical) of other specified internal prosthetic devices, implants and grafts, initial encounter: Secondary | ICD-10-CM | POA: Diagnosis not present

## 2024-03-03 DIAGNOSIS — Z4541 Encounter for adjustment and management of cerebrospinal fluid drainage device: Secondary | ICD-10-CM | POA: Diagnosis not present

## 2024-03-03 DIAGNOSIS — Z9889 Other specified postprocedural states: Secondary | ICD-10-CM | POA: Diagnosis not present

## 2024-03-03 DIAGNOSIS — G40409 Other generalized epilepsy and epileptic syndromes, not intractable, without status epilepticus: Secondary | ICD-10-CM | POA: Diagnosis not present

## 2024-03-03 DIAGNOSIS — E230 Hypopituitarism: Secondary | ICD-10-CM | POA: Diagnosis not present

## 2024-03-03 DIAGNOSIS — Z79899 Other long term (current) drug therapy: Secondary | ICD-10-CM | POA: Diagnosis not present

## 2024-03-03 DIAGNOSIS — R918 Other nonspecific abnormal finding of lung field: Secondary | ICD-10-CM | POA: Diagnosis not present

## 2024-03-03 DIAGNOSIS — Q892 Congenital malformations of other endocrine glands: Secondary | ICD-10-CM | POA: Diagnosis not present

## 2024-03-03 DIAGNOSIS — E2740 Unspecified adrenocortical insufficiency: Secondary | ICD-10-CM | POA: Diagnosis not present

## 2024-03-03 DIAGNOSIS — T85890A Other specified complication of nervous system prosthetic devices, implants and grafts, initial encounter: Secondary | ICD-10-CM | POA: Diagnosis not present

## 2024-03-03 DIAGNOSIS — J9811 Atelectasis: Secondary | ICD-10-CM | POA: Diagnosis not present

## 2024-03-03 DIAGNOSIS — E2749 Other adrenocortical insufficiency: Secondary | ICD-10-CM | POA: Diagnosis not present

## 2024-03-03 DIAGNOSIS — G9389 Other specified disorders of brain: Secondary | ICD-10-CM | POA: Diagnosis not present

## 2024-03-03 DIAGNOSIS — E039 Hypothyroidism, unspecified: Secondary | ICD-10-CM | POA: Diagnosis not present

## 2024-03-03 DIAGNOSIS — G40901 Epilepsy, unspecified, not intractable, with status epilepticus: Secondary | ICD-10-CM | POA: Diagnosis not present

## 2024-03-03 DIAGNOSIS — Z982 Presence of cerebrospinal fluid drainage device: Secondary | ICD-10-CM | POA: Diagnosis not present

## 2024-03-03 DIAGNOSIS — E871 Hypo-osmolality and hyponatremia: Secondary | ICD-10-CM | POA: Diagnosis not present

## 2024-03-03 DIAGNOSIS — Q039 Congenital hydrocephalus, unspecified: Secondary | ICD-10-CM | POA: Diagnosis not present

## 2024-03-03 DIAGNOSIS — T8501XA Breakdown (mechanical) of ventricular intracranial (communicating) shunt, initial encounter: Secondary | ICD-10-CM | POA: Diagnosis not present

## 2024-03-03 DIAGNOSIS — Z8669 Personal history of other diseases of the nervous system and sense organs: Secondary | ICD-10-CM | POA: Diagnosis not present

## 2024-03-03 DIAGNOSIS — R519 Headache, unspecified: Secondary | ICD-10-CM | POA: Diagnosis not present

## 2024-03-03 DIAGNOSIS — Z881 Allergy status to other antibiotic agents status: Secondary | ICD-10-CM | POA: Diagnosis not present

## 2024-03-04 DIAGNOSIS — G9389 Other specified disorders of brain: Secondary | ICD-10-CM | POA: Diagnosis not present

## 2024-03-04 DIAGNOSIS — Z8669 Personal history of other diseases of the nervous system and sense organs: Secondary | ICD-10-CM | POA: Diagnosis not present

## 2024-03-05 ENCOUNTER — Other Ambulatory Visit (HOSPITAL_COMMUNITY): Payer: Self-pay

## 2024-03-05 DIAGNOSIS — E230 Hypopituitarism: Secondary | ICD-10-CM | POA: Diagnosis not present

## 2024-03-05 DIAGNOSIS — T85618A Breakdown (mechanical) of other specified internal prosthetic devices, implants and grafts, initial encounter: Secondary | ICD-10-CM | POA: Diagnosis not present

## 2024-03-05 LAB — LEVETIRACETAM LEVEL

## 2024-03-05 LAB — LACOSAMIDE

## 2024-03-05 MED ORDER — HYDROCORTISONE 5 MG PO TABS
ORAL_TABLET | ORAL | 0 refills | Status: DC
  Filled 2024-03-05: qty 10, 1d supply, fill #0
  Filled 2024-03-05: qty 10, 2d supply, fill #0

## 2024-03-05 MED ORDER — ACETAMINOPHEN 325 MG PO TABS
975.0000 mg | ORAL_TABLET | Freq: Three times a day (TID) | ORAL | 0 refills | Status: DC
  Filled 2024-03-05: qty 100, 11d supply, fill #0
  Filled 2024-03-08: qty 200, 22d supply, fill #0

## 2024-03-05 MED ORDER — SENNOSIDES-DOCUSATE SODIUM 8.6-50 MG PO TABS
2.0000 | ORAL_TABLET | Freq: Two times a day (BID) | ORAL | 0 refills | Status: DC
  Filled 2024-03-05 – 2024-03-08 (×3): qty 80, 20d supply, fill #0

## 2024-03-05 MED ORDER — POLYETHYLENE GLYCOL 3350 17 GM/SCOOP PO POWD
17.0000 g | Freq: Every day | ORAL | 0 refills | Status: DC
  Filled 2024-03-05: qty 510, 30d supply, fill #0
  Filled 2024-03-05: qty 476, 28d supply, fill #0

## 2024-03-05 MED ORDER — BACITRACIN 500 UNIT/GM EX OINT
1.0000 | TOPICAL_OINTMENT | Freq: Every day | CUTANEOUS | 0 refills | Status: DC
  Filled 2024-03-05: qty 28.4, 10d supply, fill #0

## 2024-03-05 MED ORDER — HYDROCORTISONE 5 MG PO TABS
5.0000 mg | ORAL_TABLET | ORAL | 0 refills | Status: DC
  Filled 2024-03-05: qty 20, 2d supply, fill #0

## 2024-03-06 DIAGNOSIS — Z982 Presence of cerebrospinal fluid drainage device: Secondary | ICD-10-CM | POA: Diagnosis not present

## 2024-03-06 DIAGNOSIS — R918 Other nonspecific abnormal finding of lung field: Secondary | ICD-10-CM | POA: Diagnosis not present

## 2024-03-06 DIAGNOSIS — Q039 Congenital hydrocephalus, unspecified: Secondary | ICD-10-CM | POA: Diagnosis not present

## 2024-03-06 DIAGNOSIS — T85618A Breakdown (mechanical) of other specified internal prosthetic devices, implants and grafts, initial encounter: Secondary | ICD-10-CM | POA: Diagnosis not present

## 2024-03-06 DIAGNOSIS — Z9889 Other specified postprocedural states: Secondary | ICD-10-CM | POA: Diagnosis not present

## 2024-03-06 DIAGNOSIS — T8501XA Breakdown (mechanical) of ventricular intracranial (communicating) shunt, initial encounter: Secondary | ICD-10-CM | POA: Diagnosis not present

## 2024-03-06 DIAGNOSIS — Z4541 Encounter for adjustment and management of cerebrospinal fluid drainage device: Secondary | ICD-10-CM | POA: Diagnosis not present

## 2024-03-06 DIAGNOSIS — E230 Hypopituitarism: Secondary | ICD-10-CM | POA: Diagnosis not present

## 2024-03-06 DIAGNOSIS — T85618D Breakdown (mechanical) of other specified internal prosthetic devices, implants and grafts, subsequent encounter: Secondary | ICD-10-CM | POA: Diagnosis not present

## 2024-03-06 DIAGNOSIS — E2740 Unspecified adrenocortical insufficiency: Secondary | ICD-10-CM | POA: Diagnosis not present

## 2024-03-06 DIAGNOSIS — Z8669 Personal history of other diseases of the nervous system and sense organs: Secondary | ICD-10-CM | POA: Diagnosis not present

## 2024-03-06 DIAGNOSIS — T8509XA Other mechanical complication of ventricular intracranial (communicating) shunt, initial encounter: Secondary | ICD-10-CM | POA: Diagnosis not present

## 2024-03-07 DIAGNOSIS — E230 Hypopituitarism: Secondary | ICD-10-CM | POA: Diagnosis not present

## 2024-03-07 DIAGNOSIS — T85618A Breakdown (mechanical) of other specified internal prosthetic devices, implants and grafts, initial encounter: Secondary | ICD-10-CM | POA: Diagnosis not present

## 2024-03-08 ENCOUNTER — Other Ambulatory Visit (HOSPITAL_COMMUNITY): Payer: Self-pay

## 2024-03-08 ENCOUNTER — Other Ambulatory Visit (HOSPITAL_BASED_OUTPATIENT_CLINIC_OR_DEPARTMENT_OTHER): Payer: Self-pay

## 2024-03-08 DIAGNOSIS — E230 Hypopituitarism: Secondary | ICD-10-CM | POA: Diagnosis not present

## 2024-03-08 DIAGNOSIS — T85618A Breakdown (mechanical) of other specified internal prosthetic devices, implants and grafts, initial encounter: Secondary | ICD-10-CM | POA: Diagnosis not present

## 2024-03-08 MED ORDER — HYDROCORTISONE 10 MG PO TABS
ORAL_TABLET | ORAL | 0 refills | Status: DC
  Filled 2024-03-08: qty 5, 2d supply, fill #0

## 2024-03-20 DIAGNOSIS — G919 Hydrocephalus, unspecified: Secondary | ICD-10-CM | POA: Diagnosis not present

## 2024-03-22 ENCOUNTER — Other Ambulatory Visit (HOSPITAL_COMMUNITY): Payer: Self-pay

## 2024-03-25 ENCOUNTER — Other Ambulatory Visit (HOSPITAL_COMMUNITY): Payer: Self-pay

## 2024-03-25 ENCOUNTER — Telehealth: Payer: Self-pay | Admitting: Neurology

## 2024-03-25 NOTE — Telephone Encounter (Signed)
 Pt.s Mom states that she wants Dr. Georjean to know Pt had to have emergency surgery at [facility]. He had two surgeries 1st on 7/27. His shunt malfunctioned with multiple seizures. 2nd surgery was done on 7/30 due to blood clot in the valve of the shunt. He came home 8/1.

## 2024-03-26 NOTE — Telephone Encounter (Signed)
 Spoke to patient and mother. He has no recollection of events after he got home from work with headache until he was at [facility]. He is feeling better now. No changes to his seizure medications. He has a f/u with Neurosurg in Sept and with me in Oct. He is out from work until 10/29

## 2024-03-27 MED FILL — Naloxone HCl Soln Prefilled Syringe 2 MG/2ML: INTRAMUSCULAR | Qty: 2 | Status: AC

## 2024-03-27 MED FILL — Fentanyl Citrate Preservative Free (PF) Inj 100 MCG/2ML: INTRAMUSCULAR | Qty: 2 | Status: AC

## 2024-04-22 ENCOUNTER — Other Ambulatory Visit (HOSPITAL_COMMUNITY): Payer: Self-pay

## 2024-04-29 DIAGNOSIS — G919 Hydrocephalus, unspecified: Secondary | ICD-10-CM | POA: Diagnosis not present

## 2024-04-29 DIAGNOSIS — Z982 Presence of cerebrospinal fluid drainage device: Secondary | ICD-10-CM | POA: Diagnosis not present

## 2024-05-02 ENCOUNTER — Other Ambulatory Visit (HOSPITAL_COMMUNITY): Payer: Self-pay

## 2024-05-06 ENCOUNTER — Other Ambulatory Visit (HOSPITAL_COMMUNITY): Payer: Self-pay

## 2024-05-13 ENCOUNTER — Other Ambulatory Visit (HOSPITAL_COMMUNITY): Payer: Self-pay

## 2024-05-14 ENCOUNTER — Other Ambulatory Visit (HOSPITAL_COMMUNITY): Payer: Self-pay

## 2024-05-14 DIAGNOSIS — Z79899 Other long term (current) drug therapy: Secondary | ICD-10-CM | POA: Diagnosis not present

## 2024-05-14 DIAGNOSIS — E559 Vitamin D deficiency, unspecified: Secondary | ICD-10-CM | POA: Diagnosis not present

## 2024-05-14 DIAGNOSIS — E2749 Other adrenocortical insufficiency: Secondary | ICD-10-CM | POA: Diagnosis not present

## 2024-05-14 DIAGNOSIS — Q892 Congenital malformations of other endocrine glands: Secondary | ICD-10-CM | POA: Diagnosis not present

## 2024-05-14 DIAGNOSIS — E230 Hypopituitarism: Secondary | ICD-10-CM | POA: Diagnosis not present

## 2024-05-14 DIAGNOSIS — E038 Other specified hypothyroidism: Secondary | ICD-10-CM | POA: Diagnosis not present

## 2024-05-14 MED ORDER — HYPODERMIC NEEDLE 18G X 1" MISC
1.0000 | 4 refills | Status: AC
  Filled 2024-05-14: qty 12, 84d supply, fill #0

## 2024-05-14 MED ORDER — HYDROCORTISONE 5 MG PO TABS: 5.0000 mg | ORAL_TABLET | Freq: Three times a day (TID) | ORAL | 3 refills | Status: AC

## 2024-05-14 MED ORDER — SYNTHROID 112 MCG PO TABS: 112.0000 ug | ORAL_TABLET | Freq: Every day | ORAL | 4 refills | Status: AC

## 2024-05-14 MED ORDER — BD TB SYRINGE 27G X 1/2" 1 ML MISC
1.0000 | 4 refills | Status: AC
  Filled 2024-05-14: qty 12, 84d supply, fill #0

## 2024-05-14 MED ORDER — TESTOSTERONE CYPIONATE 200 MG/ML IM SOLN
50.0000 mg | INTRAMUSCULAR | 1 refills | Status: AC
  Filled 2024-05-14: qty 12, 84d supply, fill #0

## 2024-05-15 ENCOUNTER — Other Ambulatory Visit (HOSPITAL_COMMUNITY): Payer: Self-pay

## 2024-05-16 ENCOUNTER — Other Ambulatory Visit (HOSPITAL_COMMUNITY): Payer: Self-pay

## 2024-05-17 ENCOUNTER — Other Ambulatory Visit (HOSPITAL_COMMUNITY): Payer: Self-pay

## 2024-05-17 MED ORDER — DEXAMETHASONE SODIUM PHOSPHATE 4 MG/ML IJ SOLN
4.0000 mg | INTRAMUSCULAR | 3 refills | Status: AC | PRN
  Filled 2024-05-17: qty 1, 1d supply, fill #0

## 2024-05-20 ENCOUNTER — Other Ambulatory Visit (HOSPITAL_COMMUNITY): Payer: Self-pay

## 2024-05-21 ENCOUNTER — Other Ambulatory Visit (HOSPITAL_COMMUNITY): Payer: Self-pay

## 2024-05-22 ENCOUNTER — Other Ambulatory Visit (HOSPITAL_COMMUNITY): Payer: Self-pay

## 2024-05-24 ENCOUNTER — Other Ambulatory Visit (HOSPITAL_BASED_OUTPATIENT_CLINIC_OR_DEPARTMENT_OTHER): Payer: Self-pay

## 2024-05-28 ENCOUNTER — Other Ambulatory Visit (HOSPITAL_COMMUNITY): Payer: Self-pay

## 2024-06-04 ENCOUNTER — Encounter: Payer: Self-pay | Admitting: Neurology

## 2024-06-04 ENCOUNTER — Ambulatory Visit: Payer: Commercial Managed Care - PPO | Admitting: Neurology

## 2024-06-04 ENCOUNTER — Other Ambulatory Visit: Payer: Self-pay

## 2024-06-04 ENCOUNTER — Other Ambulatory Visit (HOSPITAL_COMMUNITY): Payer: Self-pay

## 2024-06-04 DIAGNOSIS — G40209 Localization-related (focal) (partial) symptomatic epilepsy and epileptic syndromes with complex partial seizures, not intractable, without status epilepticus: Secondary | ICD-10-CM | POA: Diagnosis not present

## 2024-06-04 MED ORDER — LACOSAMIDE 200 MG PO TABS
200.0000 mg | ORAL_TABLET | Freq: Two times a day (BID) | ORAL | 3 refills | Status: AC
  Filled 2024-06-04: qty 180, 90d supply, fill #0
  Filled ????-??-??: fill #0

## 2024-06-04 MED ORDER — LEVETIRACETAM 750 MG PO TABS
1500.0000 mg | ORAL_TABLET | Freq: Two times a day (BID) | ORAL | 3 refills | Status: AC
  Filled 2024-06-04 – 2024-08-06 (×2): qty 360, 90d supply, fill #0

## 2024-06-04 NOTE — Patient Instructions (Signed)
 It's good to see you doing good! Continue all your medications. Follow-up in 6 months, call for any changes.    Seizure Precautions: 1. If medication has been prescribed for you to prevent seizures, take it exactly as directed.  Do not stop taking the medicine without talking to your doctor first, even if you have not had a seizure in a long time.   2. Avoid activities in which a seizure would cause danger to yourself or to others.  Don't operate dangerous machinery, swim alone, or climb in high or dangerous places, such as on ladders, roofs, or girders.  Do not drive unless your doctor says you may.  3. If you have any warning that you may have a seizure, lay down in a safe place where you can't hurt yourself.    4.  No driving for 6 months from last seizure, as per [place]  state law.   Please refer to the following link on the [employer]'s website for more information: http://www.epilepsyfoundation.org/answerplace/Social/driving/drivingu.cfm   5.  Maintain good sleep hygiene.  6.  Contact your doctor if you have any problems that may be related to the medicine you are taking.  7.  Call 911 and bring the patient back to the ED if:        A.  The seizure lasts longer than 5 minutes.       B.  The patient doesn't awaken shortly after the seizure  C.  The patient has new problems such as difficulty seeing, speaking or moving  D.  The patient was injured during the seizure  E.  The patient has a temperature over 102 F (39C)  F.  The patient vomited and now is having trouble breathing

## 2024-06-04 NOTE — Progress Notes (Signed)
 NEUROLOGY FOLLOW UP OFFICE NOTE  Clayton Torres Torres March 22, 1991  HISTORY OF PRESENT ILLNESS: I had the pleasure of seeing Clayton Torres in follow-up in the neurology clinic on 06/04/2024.  The patient was last seen on a year ago for seizures. He is again accompanied by his parents who help supplement the history today.  Records and images were personally reviewed where available. Since his last visit, he was admitted at [facility] for shunt malfunction. He went to the [facility] ER first on 7/26 for worsening headache, ataxia, confusion. Head CT showed significant dilatation of the lateral and thrid ventricles, blunting of sulcal markings. He had a seizure while in the CT scanner, he was gazing to one side and not responding to staff, then was amnestic of the episode and started vomiting. He was given Ativan , Zofran , and IV Keppra . He was transferred to [facility] where he had a VP shunt revision, then a few days later he had altered mental status and forced down gaze, CT head showed interval increase in ventricles so he had to have replacement of right parietal VP shunt. His last CT head 04/29/24 showed significantly decrease in size of ventricles, no acute changes. His seizure medications were continued, he is on Levetiracetam  750mg  2 tabs BID ([employer] manufacturer) and Lacosamide  200mg  BID ([employer]amber manufacturer) without side effects. He has been back to work full-time since last week. No staring/unresponsive episodes, gaps in time, focal numbness/tingling/weakness, myoclonic jerks. No headaches, dizziness, vision changes, no falls. He feels good.   HPI: This is a pleasant 33 yo LH man with a history of congenital hydrocephalus s/p VP shunt at age 44 months and shunt revision in 2001, hypopituitarism, and seizures since 2006. The first seizure occurred in August 2006 in the setting of the patient not eating and being in a very hot environment. He hit the left side of his head and had a witnessed GTC. He  has no recollection/warning that he could remember. He had an EEG at that time and was told they could see where the spike was, and was started on Dilantin. He was switched by his neurologist in [place]  to Keppra  a few months after. He went over a year without any seizures and medication taper was discussed. Within 2 weeks, he had a seizure in October 2007 out of sleep. Keppra  was resumed and he did well for almost 7 years without any seizures until January 2014 when he had another GTC and Keppra  dose was increased. He had several seizures that year, in March 2014, June 2014, and October 2014. He recalls feeling dizzy with the seizure in 05/2013, he fell in the tub. He was taking Keppra  1500mg  BID and Oxtellar was added. He had hyponatremia with a sodium of 119 and Oxtellar was stopped. He was also tried on Aptiom which also caused hyponatremia. He was started on Vimpat  in October 2014 and dose of 150mg  BID seemed to be the magic combination for him. His sodium levels stabilized between 135-138. Last Keppra  level in March 2016 was 15. A Vimpat  level in June 2015 was 29. He was again doing well with no seizures for almost 3 years until he had a witnessed convulsion on 10/15/15. He was at the computer writing a check, then his mother reported he did not answer her then turned red, followed by jerking and drooling for 5 minutes. No tongue bite or incontinence. After the seizure, he had sonorous respirations. He was also noted to break out in petechiae over  his face. He was brought to [facility] ER where bloodwork where CBC and CMP were normal, Keppra  level was low at 4.8. He had a head CT without contrast which showed the right-sided transparietal VP catheter terminating just left of the midline, stable to prior study, no hydrocephalus. He had been seeing neurologist Dr. Ines and Vimpat  dose was increased to 200mg  BID. Most recent drug levels on 10/2015 showed a Keppra  level of 13.7 (ref 10-40) and Vimpat  level of 6.3  (ref 5-10).   They report that he got less sleep the night prior to the recent seizure, with only 4-1/2 hours of sleep. He denied any missed medication doses, no recent infections or alcohol  intake. His parents deny any episodes of staring/unresponsiveness. He denies any gaps in time, olfactory/gustatory hallucinations, deja vu, rising epigastric sensation, focal numbness/tingling/weakness, myoclonic jerks. He works at a [employer], he does not drive.    Epilepsy Risk Factors:  Patient was born premature at 7 months with congenital hydrocephalus s/p VP shunt. He has a history of panhypopituitarism. Otherwise no history of febrile convulsions, CNS infections such as meningitis/encephalitis, significant traumatic brain injury, or family history of seizures.  Diagnostic Data: I personally reviewed MRI brain with and without contrast which did not show any acute changes. 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 There is a tiny developmental venous anomaly is noted in the anterior left frontal lobe. His 1-hour sleep-deprived EEG was abnormal with occasional focal slowing over the left posterior temporal region, occasional epileptiform discharges over the left posterior temporal region, a single sharp transient was seen over the right frontal region.     Prior AEDs: Dilantin, generic Keppra  (side effects and impaired seizure control per prior records)  PAST MEDICAL HISTORY: Past Medical History:  Diagnosis Date   COVID-19    Hydrocephalus (HCC)    Seizure disorder (HCC)     MEDICATIONS: Current Outpatient Medications on File Prior to Visit  Medication Sig Dispense Refill    Aloe-Sodium Chloride (AYR SALINE NASAL GEL NA) Place 1 Dose into the nose daily.     Ascorbic Acid (VITAMIN C) 1000 MG tablet Take 1,000 mg by mouth daily.     Cholecalciferol (VITAMIN D-3 PO) Take by mouth.     CORTEF  5 MG tablet Take 5 mg by mouth 2 (two) times daily. Will take 3 if increase stress     dexamethasone  (DECADRON ) 4 MG/ML injection Inject 4 mg intramusculary as needed for adrenal crisis 1 mL 3   dexamethasone  (DECADRON ) 4 MG/ML injection Inject 1 mL (4 mg total) into the muscle as needed for adrenal crisis 1 mL 3   dexamethasone  (DECADRON ) 4 MG/ML injection Inject 1 mL (4 mg total) into the muscle as needed for adrenal crisis. 1 mL 3   Fexofenadine HCl (ALLEGRA PO) Take 180 mg by mouth daily. Every other day     fexofenadine-pseudoephedrine (ALLEGRA-D 24) 180-240 MG 24 hr tablet Take 1 tablet by mouth. Every other day     hydrocortisone  (CORTEF ) 5 MG tablet Take 1 tablet (5 mg total) by mouth 2 (two) times daily. May increase to 1 tablet three times daily on sick days or at work as directed 90 tablet 3   hydrocortisone  (CORTEF )  5 MG tablet Take 1 tablet (5 mg total) by mouth 2 (two) times daily from Tuesday-Friday and 1 tablet (5 mg total) by mouth 3 (three) times daily from Saturday-Monday. 240 tablet 3   hydrocortisone  (CORTEF ) 5 MG tablet Take 1 tablet (5 mg total) by mouth 2 (two) times daily from Tuesday-Friday and 1 tablet (5 mg total) by mouth 3 (three) times daily from Saturday-Monday. 240 tablet 3   Insulin  Syringe-Needle U-100 27G X 1/2 1 ML MISC To administer subcutaneous testosterone  weekly 12 each 4   Multiple Vitamins tablet Take 1 tablet by mouth daily.     Needle, Disp, (HYPODERMIC NEEDLE 18GX1) 18G X 1 MISC Use 1 each once a week To draw up testosterone  12 each 4   Needle, Disp, (HYPODERMIC NEEDLE 18GX1) 18G X 1 MISC Use 1 each once a week To draw up testosterone  12 each 4   Needle, Disp, (HYPODERMIC NEEDLE 18GX1) 18G X 1 MISC Use once a week To draw up  testosterone . 12 each 4   NEEDLE, DISP, 18 G 18G X 1 MISC use 1 syringe once a week to draw up testosterone  12 each 4   SYNTHROID  112 MCG tablet Take 112 mcg by mouth daily.     SYNTHROID  112 MCG tablet TAKE 1 TABLET (112 MCG TOTAL) BY MOUTH ONCE DAILY TAKE ON AN EMPTY STOMACH WITH A GLASS OF WATER AT LEAST 30-60 MINUTES BEFORE BREAKFAST. 90 tablet 2   SYNTHROID  112 MCG tablet Take 1 tablet (112 mcg total) by mouth daily Monday-Saturday, then 1.5 tablets (168 mcg total) daily on Sundays. 102 tablet 4   SYNTHROID  112 MCG tablet Take 1 tablet (112 mcg total) by mouth once daily Take 1 tablet (112 mcg total) by mouth daily 6 days of the week, take 1.5 tablets (168mcg) on Sundays. 102 tablet 4   testosterone  cypionate (DEPOTESTOSTERONE CYPIONATE) 200 MG/ML injection Inject 0.25 mLs (50 mg total) into the skin once a week. 4 mL 5   testosterone  cypionate (DEPOTESTOSTERONE CYPIONATE) 200 MG/ML injection Inject 0.25 mLs (50 mg total) into the skin once a week. 12 mL 1   TUBERCULIN SYR 1CC/27GX1/2 (B-D TB SYRINGE 1CC/27GX1/2) 27G X 1/2 1 ML MISC Inject testosterone  into the skin once a week. DO NOT FILL WITH SAFETY GLIDE NEEDLES. MUST HAVE NDC 1709-690376. 12 each 4   TUBERCULIN SYR 1CC/27GX1/2 (B-D TB SYRINGE 1CC/27GX1/2) 27G X 1/2 1 ML MISC Use once a week to administer subcutaneous testosterone . 12 each 4   testosterone  (ANDRODERM ) 4 MG/24HR PT24 patch PLACE 1 PATCH ONTO THE SKIN NIGHTLY 90 patch 0   No current facility-administered medications on file prior to visit.    ALLERGIES: Allergies  Allergen Reactions   Benzonatate Diarrhea   Ceftriaxone Hives   Eslicarbazepine Other (See Comments)    Low soduim   Oxcarbazepine Other (See Comments)    Low soduim   Potassium Hives    IV potassium only   Testosterone  Rash    Rash with topical products    FAMILY HISTORY: Family History  Problem Relation Age of Onset   Hypertension Other    Diabetes Other    CAD Other    COPD Other     Cancer Paternal Grandmother    Cancer Paternal Grandfather     SOCIAL HISTORY: Social History   Socioeconomic History   Marital status: Single    Spouse name: Not on file   Number of children: Not on file   Years of education: Not on file  Highest education level: Not on file  Occupational History   Occupation: Futures Trader  Tobacco Use   Smoking status: Never   Smokeless tobacco: Never  Vaping Use   Vaping status: Never Used  Substance and Sexual Activity   Alcohol  use: No    Alcohol /week: 0.0 standard drinks of alcohol    Drug use: No   Sexual activity: Not on file  Other Topics Concern   Not on file  Social History Narrative   Lives w/ parents   Caffeine use: 1 cup coffee per day      PA's: [phone]      Left handed    Social Drivers of Health   Financial Resource Strain: Low Risk  (05/13/2024)   Received from [facility]   Overall Financial Resource Strain (CARDIA)    Difficulty of Paying Living Expenses: Not hard at all  Food Insecurity: No Food Insecurity (05/13/2024)   Received from [facility]   Hunger Vital Sign    Within the past 12 months, you worried that your food would run out before you got the money to buy more.: Never true    Within the past 12 months, the food you bought just didn't last and you didn't have money to get more.: Never true  Transportation Needs: No Transportation Needs (05/13/2024)   Received from [facility] - Transportation    In the past 12 months, has lack of transportation kept you from medical appointments or from getting medications?: No    Lack of Transportation (Non-Medical): No  Physical Activity: Sufficiently Active (04/28/2020)   Received from [facility]   Exercise Vital Sign    On average, how many days per week do you engage in moderate to strenuous exercise (like a brisk walk)?: 2 days    On average, how many minutes do  you engage in exercise at this level?: 150+ min  Stress: No Stress Concern Present (04/28/2020)   Received from [facility] - Occupational Stress Questionnaire    Feeling of Stress : Not at all  Social Connections: Not on file  Intimate Partner Violence: Not on file     PHYSICAL EXAM: Vitals:   06/04/24 1438  BP: 134/82  Pulse: 90  SpO2: 97%   General: No acute distress Head:  Normocephalic/atraumatic Skin/Extremities: No rash, no edema Neurological Exam: alert and awake. No aphasia or dysarthria. Fund of knowledge is appropriate.  Attention and concentration are normal.   Cranial nerves: Pupils equal, round. Extraocular movements intact with no nystagmus. Visual fields full.  No facial asymmetry.  Motor: Bulk and tone normal, muscle strength 5/5 throughout with no pronator drift.   Finger to nose testing intact.  Gait narrow-based and steady, able to tandem walk adequately.  Romberg negative.   IMPRESSION: This is a pleasant 33 yo LH man with a history of congenital hydrocephalus s/p VP shunt, panhypopituitarism, and focal to bilateral tonic-clonic epilepsy since 2006. All his seizures have been until March 2017 were convulsive, however he had a focal impaired awareness seizure in the setting of hydrocephalus due to shunt malfunction on 03/02/24. He underwent 2 shunt revision procedures, last imaging a month ago showed significantly decreased ventricular system size. He is doing well, continue Levetiracetam  1500mg  BID (750mg  2 tabs BID - [employer] manufacturer) and Lacosamide  200mg  BID ([employer]amber manufacturer). He does not drive. Continue follow-up with Neurosurgery at [facility]. Follow-up in  6 months, call for any changes.   Thank you for allowing me to participate in his care.  Please do not hesitate to call for any questions or concerns.    Darice Shivers, M.D.   CC: Dr. Toribio

## 2024-06-24 ENCOUNTER — Other Ambulatory Visit (HOSPITAL_COMMUNITY): Payer: Self-pay

## 2024-06-25 ENCOUNTER — Encounter: Payer: Self-pay | Admitting: Neurology

## 2024-06-26 ENCOUNTER — Other Ambulatory Visit (HOSPITAL_COMMUNITY): Payer: Self-pay

## 2024-07-12 ENCOUNTER — Other Ambulatory Visit (HOSPITAL_COMMUNITY): Payer: Self-pay

## 2024-07-15 ENCOUNTER — Other Ambulatory Visit (HOSPITAL_COMMUNITY): Payer: Self-pay

## 2024-07-16 ENCOUNTER — Other Ambulatory Visit (HOSPITAL_COMMUNITY): Payer: Self-pay

## 2024-07-17 ENCOUNTER — Other Ambulatory Visit (HOSPITAL_BASED_OUTPATIENT_CLINIC_OR_DEPARTMENT_OTHER): Payer: Self-pay

## 2024-07-18 ENCOUNTER — Other Ambulatory Visit: Payer: Self-pay

## 2024-07-18 DIAGNOSIS — H5347 Heteronymous bilateral field defects: Secondary | ICD-10-CM | POA: Diagnosis not present

## 2024-07-23 DIAGNOSIS — E039 Hypothyroidism, unspecified: Secondary | ICD-10-CM | POA: Diagnosis not present

## 2024-07-31 ENCOUNTER — Other Ambulatory Visit (HOSPITAL_COMMUNITY): Payer: Self-pay

## 2024-08-02 ENCOUNTER — Other Ambulatory Visit (HOSPITAL_COMMUNITY): Payer: Self-pay

## 2024-08-05 ENCOUNTER — Other Ambulatory Visit (HOSPITAL_COMMUNITY): Payer: Self-pay

## 2024-08-06 ENCOUNTER — Other Ambulatory Visit (HOSPITAL_COMMUNITY): Payer: Self-pay

## 2024-08-23 ENCOUNTER — Other Ambulatory Visit (HOSPITAL_COMMUNITY): Payer: Self-pay

## 2024-09-06 ENCOUNTER — Other Ambulatory Visit (HOSPITAL_COMMUNITY): Payer: Self-pay

## 2024-09-08 ENCOUNTER — Other Ambulatory Visit (HOSPITAL_COMMUNITY): Payer: Self-pay

## 2024-09-09 ENCOUNTER — Other Ambulatory Visit (HOSPITAL_COMMUNITY): Payer: Self-pay

## 2024-09-10 ENCOUNTER — Other Ambulatory Visit: Payer: Self-pay

## 2024-09-10 ENCOUNTER — Other Ambulatory Visit (HOSPITAL_COMMUNITY): Payer: Self-pay

## 2024-09-11 ENCOUNTER — Other Ambulatory Visit: Payer: Self-pay

## 2024-12-03 ENCOUNTER — Ambulatory Visit: Admitting: Neurology
